# Patient Record
Sex: Female | Born: 1970 | Race: Black or African American | Hispanic: No | Smoking: Never smoker
Health system: Southern US, Community
[De-identification: ages and names within clinical notes are randomized; demographics above are authoritative.]

## PROBLEM LIST (undated history)

## (undated) DIAGNOSIS — E119 Type 2 diabetes mellitus without complications: Secondary | ICD-10-CM

## (undated) DIAGNOSIS — I10 Essential (primary) hypertension: Secondary | ICD-10-CM

## (undated) DIAGNOSIS — I639 Cerebral infarction, unspecified: Secondary | ICD-10-CM

## (undated) DIAGNOSIS — R531 Weakness: Secondary | ICD-10-CM

## (undated) DIAGNOSIS — N189 Chronic kidney disease, unspecified: Secondary | ICD-10-CM

## (undated) HISTORY — PX: ABDOMINAL HYSTERECTOMY: SHX81

## (undated) HISTORY — PX: EYE SURGERY: SHX253

---

## 2000-09-29 ENCOUNTER — Emergency Department (HOSPITAL_COMMUNITY): Admission: EM | Admit: 2000-09-29 | Discharge: 2000-09-29 | Payer: Self-pay | Admitting: Internal Medicine

## 2018-08-13 ENCOUNTER — Emergency Department (HOSPITAL_COMMUNITY): Payer: Medicaid - Out of State

## 2018-08-13 ENCOUNTER — Inpatient Hospital Stay (HOSPITAL_COMMUNITY)
Admission: EM | Admit: 2018-08-13 | Discharge: 2018-08-17 | DRG: 065 | Disposition: A | Discharge status: Home Health | Payer: Medicaid - Out of State | Attending: Internal Medicine | Admitting: Internal Medicine

## 2018-08-13 ENCOUNTER — Other Ambulatory Visit: Payer: Self-pay

## 2018-08-13 ENCOUNTER — Encounter (HOSPITAL_COMMUNITY): Payer: Self-pay | Admitting: Emergency Medicine

## 2018-08-13 DIAGNOSIS — E669 Obesity, unspecified: Secondary | ICD-10-CM | POA: Diagnosis present

## 2018-08-13 DIAGNOSIS — Z794 Long term (current) use of insulin: Secondary | ICD-10-CM

## 2018-08-13 DIAGNOSIS — E785 Hyperlipidemia, unspecified: Secondary | ICD-10-CM | POA: Diagnosis present

## 2018-08-13 DIAGNOSIS — Z7902 Long term (current) use of antithrombotics/antiplatelets: Secondary | ICD-10-CM

## 2018-08-13 DIAGNOSIS — E11649 Type 2 diabetes mellitus with hypoglycemia without coma: Secondary | ICD-10-CM | POA: Diagnosis present

## 2018-08-13 DIAGNOSIS — R253 Fasciculation: Secondary | ICD-10-CM | POA: Diagnosis present

## 2018-08-13 DIAGNOSIS — I69320 Aphasia following cerebral infarction: Secondary | ICD-10-CM

## 2018-08-13 DIAGNOSIS — Z79899 Other long term (current) drug therapy: Secondary | ICD-10-CM

## 2018-08-13 DIAGNOSIS — I6502 Occlusion and stenosis of left vertebral artery: Secondary | ICD-10-CM | POA: Diagnosis present

## 2018-08-13 DIAGNOSIS — I10 Essential (primary) hypertension: Secondary | ICD-10-CM | POA: Diagnosis present

## 2018-08-13 DIAGNOSIS — Z7982 Long term (current) use of aspirin: Secondary | ICD-10-CM

## 2018-08-13 DIAGNOSIS — E1151 Type 2 diabetes mellitus with diabetic peripheral angiopathy without gangrene: Secondary | ICD-10-CM | POA: Diagnosis present

## 2018-08-13 DIAGNOSIS — I6389 Other cerebral infarction: Secondary | ICD-10-CM | POA: Diagnosis not present

## 2018-08-13 DIAGNOSIS — Z90710 Acquired absence of both cervix and uterus: Secondary | ICD-10-CM

## 2018-08-13 DIAGNOSIS — R29704 NIHSS score 4: Secondary | ICD-10-CM | POA: Diagnosis present

## 2018-08-13 DIAGNOSIS — Z8249 Family history of ischemic heart disease and other diseases of the circulatory system: Secondary | ICD-10-CM

## 2018-08-13 DIAGNOSIS — I639 Cerebral infarction, unspecified: Secondary | ICD-10-CM | POA: Diagnosis present

## 2018-08-13 DIAGNOSIS — Z6834 Body mass index (BMI) 34.0-34.9, adult: Secondary | ICD-10-CM

## 2018-08-13 DIAGNOSIS — Z79891 Long term (current) use of opiate analgesic: Secondary | ICD-10-CM

## 2018-08-13 DIAGNOSIS — I63512 Cerebral infarction due to unspecified occlusion or stenosis of left middle cerebral artery: Secondary | ICD-10-CM | POA: Diagnosis not present

## 2018-08-13 DIAGNOSIS — I69351 Hemiplegia and hemiparesis following cerebral infarction affecting right dominant side: Secondary | ICD-10-CM

## 2018-08-13 DIAGNOSIS — E119 Type 2 diabetes mellitus without complications: Secondary | ICD-10-CM

## 2018-08-13 DIAGNOSIS — G40209 Localization-related (focal) (partial) symptomatic epilepsy and epileptic syndromes with complex partial seizures, not intractable, without status epilepticus: Secondary | ICD-10-CM | POA: Diagnosis present

## 2018-08-13 HISTORY — DX: Cerebral infarction, unspecified: I63.9

## 2018-08-13 HISTORY — DX: Essential (primary) hypertension: I10

## 2018-08-13 HISTORY — DX: Type 2 diabetes mellitus without complications: E11.9

## 2018-08-13 LAB — DIFFERENTIAL
Abs Immature Granulocytes: 0.02 10*3/uL (ref 0.00–0.07)
Basophils Absolute: 0.1 10*3/uL (ref 0.0–0.1)
Basophils Relative: 1 %
EOS PCT: 3 %
Eosinophils Absolute: 0.2 10*3/uL (ref 0.0–0.5)
Immature Granulocytes: 0 %
LYMPHS ABS: 1.9 10*3/uL (ref 0.7–4.0)
LYMPHS PCT: 25 %
MONOS PCT: 8 %
Monocytes Absolute: 0.6 10*3/uL (ref 0.1–1.0)
NEUTROS ABS: 4.9 10*3/uL (ref 1.7–7.7)
Neutrophils Relative %: 63 %

## 2018-08-13 LAB — CBC
HCT: 36 % (ref 36.0–46.0)
HEMOGLOBIN: 11.4 g/dL — AB (ref 12.0–15.0)
MCH: 27.1 pg (ref 26.0–34.0)
MCHC: 31.7 g/dL (ref 30.0–36.0)
MCV: 85.7 fL (ref 80.0–100.0)
Platelets: 386 10*3/uL (ref 150–400)
RBC: 4.2 MIL/uL (ref 3.87–5.11)
RDW: 14 % (ref 11.5–15.5)
WBC: 7.7 10*3/uL (ref 4.0–10.5)
nRBC: 0 % (ref 0.0–0.2)

## 2018-08-13 LAB — COMPREHENSIVE METABOLIC PANEL
ALK PHOS: 63 U/L (ref 38–126)
ALT: 19 U/L (ref 0–44)
AST: 16 U/L (ref 15–41)
Albumin: 4.4 g/dL (ref 3.5–5.0)
Anion gap: 9 (ref 5–15)
BILIRUBIN TOTAL: 1 mg/dL (ref 0.3–1.2)
BUN: 14 mg/dL (ref 6–20)
CALCIUM: 9.5 mg/dL (ref 8.9–10.3)
CO2: 25 mmol/L (ref 22–32)
CREATININE: 1.24 mg/dL — AB (ref 0.44–1.00)
Chloride: 106 mmol/L (ref 98–111)
GFR calc Af Amer: 58 mL/min — ABNORMAL LOW (ref >60)
GFR, EST NON AFRICAN AMERICAN: 50 mL/min — AB (ref >60)
Glucose, Bld: 78 mg/dL (ref 70–99)
Potassium: 3.6 mmol/L (ref 3.5–5.1)
SODIUM: 140 mmol/L (ref 135–145)
TOTAL PROTEIN: 9 g/dL — AB (ref 6.5–8.1)

## 2018-08-13 LAB — I-STAT CHEM 8, ED
BUN: 14 mg/dL (ref 6–20)
Calcium, Ion: 1.16 mmol/L (ref 1.15–1.40)
Chloride: 106 mmol/L (ref 98–111)
Creatinine, Ser: 1.3 mg/dL — ABNORMAL HIGH (ref 0.44–1.00)
Glucose, Bld: 75 mg/dL (ref 70–99)
HCT: 37 % (ref 36.0–46.0)
HEMOGLOBIN: 12.6 g/dL (ref 12.0–15.0)
Potassium: 3.7 mmol/L (ref 3.5–5.1)
SODIUM: 140 mmol/L (ref 135–145)
TCO2: 24 mmol/L (ref 22–32)

## 2018-08-13 LAB — CBG MONITORING, ED
GLUCOSE-CAPILLARY: 64 mg/dL — AB (ref 70–99)
Glucose-Capillary: 121 mg/dL — ABNORMAL HIGH (ref 70–99)

## 2018-08-13 LAB — I-STAT BETA HCG BLOOD, ED (MC, WL, AP ONLY)
HCG, QUANTITATIVE: 15.9 m[IU]/mL — AB (ref <5)
I-stat hCG, quantitative: 17.6 m[IU]/mL — ABNORMAL HIGH (ref <5)

## 2018-08-13 LAB — I-STAT TROPONIN, ED: Troponin i, poc: 0 ng/mL (ref 0.00–0.08)

## 2018-08-13 LAB — APTT: aPTT: 28 seconds (ref 24–36)

## 2018-08-13 LAB — PROTIME-INR
INR: 0.97
PROTHROMBIN TIME: 12.8 s (ref 11.4–15.2)

## 2018-08-13 LAB — ETHANOL

## 2018-08-13 MED ORDER — IOPAMIDOL (ISOVUE-370) INJECTION 76%
150.0000 mL | Freq: Once | INTRAVENOUS | Status: AC | PRN
  Administered 2018-08-13: 115 mL via INTRAVENOUS

## 2018-08-13 NOTE — ED Triage Notes (Signed)
Family reports symptoms started last Thursday.

## 2018-08-13 NOTE — ED Notes (Signed)
Patient resting at this time.

## 2018-08-13 NOTE — ED Notes (Signed)
Food tray given ton patient along with ginger-ale and orange juice per physician. Patient blood glucose was 64.

## 2018-08-13 NOTE — ED Provider Notes (Addendum)
[facility]ENCY DEPARTMENT Provider Note   CSN: [id number] Arrival date & time: 08/13/18  1450     History   Chief Complaint Chief Complaint  Patient presents with  . Seizures    HPI Julia Ferguson is a 47 y.o. female.  [facility].  Patient discharged on the 16th.  She had MR a CT Angie of head neck with findings consistent with left acute infarct.  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  Patient's story starts back in 2017 when she had her first stroke.  2018 she had a more significant stroke which left her with right-sided weakness to arm and leg.  And speech problems.  Patient had similar type presentation leading to this admission with episodic events that patient's family thought were more seizure-like.  But probably were stroke related.  They did started to develop concerns for any significant vasculitis.  So patient had labs drawn for this.  However patient was discharged home with speech therapy occupational therapy and follow-up with neurology but they were unable to get into neurology.  Patient was seen by primary care provider today.  They witnessed 1 of these episodic events it is that seem to be some contraction in her right arm.  And some temporary speech worsening.  [facility] but they could not make those arrangements so family brought her here.       Past Medical History:  Diagnosis Date  . Diabetes mellitus without complication (HCC)   . Hypertension   . Stroke [facility](HCC)     There are no active problems to display for this patient.   Past Surgical History:  Procedure Laterality Date  . ABDOMINAL HYSTERECTOMY    . CESAREAN SECTION       OB History   None      Home Medications    Prior to Admission medications   Medication Sig Start Date End Date Taking? Authorizing Provider  amLODipine (NORVASC) 10 MG  tablet Take by mouth.   Yes [provider]  aspirin EC 81 MG tablet Take by mouth.   Yes [provider]  atorvastatin (LIPITOR) 80 MG tablet Take by mouth.   Yes [provider]  carvedilol (COREG) 25 MG tablet Take by mouth.   Yes [provider]  clopidogrel (PLAVIX) 75 MG tablet Take by mouth.   Yes [provider]  ezetimibe (ZETIA) 10 MG tablet  08/12/18  Yes [provider]  gabapentin (NEURONTIN) 600 MG tablet Take by mouth.   Yes [provider]  glimepiride (AMARYL) 2 MG tablet  08/11/18  Yes [provider]  Insulin Glargine, 2 Unit Dial, (TOUJEO MAX SOLOSTAR) 300 UNIT/ML SOPN Inject into the skin.   Yes [provider]  losartan (COZAAR) 100 MG tablet Take 100 mg by mouth daily.   Yes [provider]  minoxidil (LONITEN) 2.5 MG tablet Take by mouth.   Yes [provider]  traMADol (ULTRAM) 50 MG tablet Take by mouth.   Yes [provider]    Family History Family History  Problem Relation Age of Onset  . Hypertension Other   . Kidney failure Other     Social History Social History   Tobacco Use  . Smoking status: Never Smoker  . Smokeless tobacco: Never Used  Substance Use Topics  . Alcohol use: Never  Frequency: Never  . Drug use: Never     Allergies   Patient has no known allergies.   Review of Systems Review of Systems  Constitutional: Negative for fever.  HENT: Negative for congestion.   Eyes: Positive for visual disturbance.  Respiratory: Negative for shortness of breath.   Cardiovascular: Negative for chest pain.  Gastrointestinal: Negative for abdominal pain.  Genitourinary: Negative for dysuria and hematuria.  Musculoskeletal: Negative for back pain and neck pain.  Skin: Negative for rash.  Neurological: Positive for seizures, speech difficulty and weakness.  Hematological: Does not bruise/bleed easily.  Psychiatric/Behavioral: Negative  for confusion.     Physical Exam Updated Vital Signs BP 129/81   Pulse 75   Temp 98 F (36.7 C) (Oral)   Resp 14   Ht 1.6 m (5\' 3" )   Wt 85.3 kg   SpO2 96%   BMI 33.30 kg/m   Physical Exam  Constitutional: She is oriented to person, place, and time. She appears well-developed and well-nourished. No distress.  HENT:  Head: Normocephalic and atraumatic.  Mouth/Throat: Oropharynx is clear and moist.  Eyes: Pupils are equal, round, and reactive to light. EOM are normal.  Neck: Normal range of motion. Neck supple.  Cardiovascular: Normal rate, regular rhythm and normal heart sounds.  Pulmonary/Chest: Effort normal and breath sounds normal. No respiratory distress.  Abdominal: Soft. Bowel sounds are normal. There is no tenderness.  Musculoskeletal: Normal range of motion. She exhibits no edema.  Neurological: She is alert and oriented to person, place, and time.  Some slurred speech baseline.  Weakness to right arm and right leg.  This is also baseline according to family.  Skin: Skin is warm.  Nursing note and vitals reviewed.    ED Treatments / Results  Labs (all labs ordered are listed, but only abnormal results are displayed) Labs Reviewed  CBC - Abnormal; Notable for the following components:      Result Value   Hemoglobin 11.4 (*)    All other components within normal limits  COMPREHENSIVE METABOLIC PANEL - Abnormal; Notable for the following components:   Creatinine, Ser 1.24 (*)    Total Protein 9.0 (*)    GFR calc non Af Amer 50 (*)    GFR calc Af Amer 58 (*)    All other components within normal limits  I-STAT CHEM 8, ED - Abnormal; Notable for the following components:   Creatinine, Ser 1.30 (*)    All other components within normal limits  I-STAT BETA HCG BLOOD, ED ([facility], [facility], [facility] ONLY) - Abnormal; Notable for the following components:   I-stat hCG, quantitative 17.6 (*)    All other components within normal limits  ETHANOL  PROTIME-INR  APTT  DIFFERENTIAL    RAPID URINE DRUG SCREEN, HOSP PERFORMED  URINALYSIS, ROUTINE W REFLEX MICROSCOPIC  I-STAT TROPONIN, ED    EKG EKG Interpretation  Date/Time:  Thursday August 13 2018 17:27:17 EST Ventricular Rate:  78 PR Interval:    QRS Duration: 85 QT Interval:  421 QTC Calculation: 480 R Axis:   -14 Text Interpretation:  Sinus rhythm LVH with secondary repolarization abnormality Anterior Q waves, possibly due to LVH No previous ECGs available Confirmed by Vanetta Mulders [phone]) on 08/13/2018 5:41:50 PM   Radiology Ct Angio Head W Or Wo Contrast  Result Date: 08/13/2018 CLINICAL DATA:  Arterial stricture/occlusion of the head and neck. Weakness beginning 2 weeks ago. Recent admission for stroke. Episodes of shaking and absence staring. EXAM: CT ANGIOGRAPHY HEAD AND  NECK CT PERFUSION BRAIN TECHNIQUE: Multidetector CT imaging of the head and neck was performed using the standard protocol during bolus administration of intravenous contrast. Multiplanar CT image reconstructions and MIPs were obtained to evaluate the vascular anatomy. Carotid stenosis measurements (when applicable) are obtained utilizing NASCET criteria, using the distal internal carotid diameter as the denominator. Multiphase CT imaging of the brain was performed following IV bolus contrast injection. Subsequent parametric perfusion maps were calculated using RAPID software. CONTRAST:  ISOVUE-370 IOPAMIDOL (ISOVUE-370) INJECTION 76% COMPARISON:  MRI brain 08/13/2018 at 4:37 p.m. FINDINGS: CT HEAD FINDINGS Brain: Remote left ACA territory infarct is noted. Additional areas of remote left MCA territory infarcts are present. The areas of more acute infarct are less well delineated on noncontrast CT of the head. The right hemisphere is unremarkable. The brainstem and cerebellum are normal. Vascular: No hyperdense vessel or unexpected calcification. Skull: Calvarium is intact. No focal lytic or blastic lesions are present. Sinuses/Orbits:  The paranasal sinuses mastoid air cells are clear. Globes and orbits are within normal limits. ASPECTS (Alberta Stroke Program Early CT Score) - Ganglionic level infarction (caudate, lentiform nuclei, internal capsule, insula, M1-M3 cortex): 7/7 - Supraganglionic infarction (M4-M6 cortex): 3/3 Total score (0-10 with 10 being normal): 10/10 Review of the MIP images confirms the above findings CTA NECK FINDINGS Aortic arch: A three-vessel arch configuration is present. There is no significant calcification at the aortic arch or stenosis. Right carotid system: Right common carotid artery is within normal limits. Right ICA bifurcation scratched at the right carotid bifurcation is normal. There is tortuosity of the distal right ICA without significant stenosis. Left carotid system: The left common carotid artery is within normal limits. Bifurcation is unremarkable. There is moderate tortuosity of the distal cervical ICA without significant stenosis. Vertebral arteries: The vertebral arteries originate from the subclavian arteries bilaterally. There is a high-grade stenosis of the left vertebral artery at its origin. The right vertebral artery is the dominant vessel. No other focal stenosis or vascular injury is present. Skeleton: Vertebral body heights alignment are maintained. 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 No focal lytic or blastic lesions are present. Hyperostosis is noted. Other neck: Subcentimeter nodules are present within the right lobe of the thyroid. No dominant lesion is present. No follow-up is necessary. No significant cervical adenopathy is present. Salivary glands are within normal limits. No focal mucosal lesions are present. Upper chest: There is dependent atelectasis at the lung apices. Mild interstitial coarsening suggests mild edema. Review of the MIP images confirms the above findings CTA HEAD FINDINGS Anterior circulation: Atherosclerotic changes are  present within the cavernous internal carotid arteries bilaterally. The lumen of the right ICA is narrowed to 1.5 mm. This compares to more distal vessel of 3 mm. There is a high-grade stenosis of the paraclinoid left ICA. 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 This compares with 2.6 mm at the bifurcation. More mild atherosclerotic changes are present in the right M1 segment. Right MCA bifurcation is intact. Left MCA bifurcation is intact. There is some attenuation of distal MCA branch vessels. Chronic left ACA occlusion is noted. Posterior circulation: The right vertebral artery is the dominant vessel. PICA origins are visualized and normal. Vertebrobasilar junction is normal. Basilar artery is within normal limits. Both posterior cerebral arteries originate from the basilar tip. There is moderate narrowing of the proximal P2 segments bilaterally. Distal PCA branch vessels are intact with distal irregularity. Venous  sinuses: Dural sinuses are patent. Straight sinus deep cerebral veins are intact. Cortical veins are within normal limits. Anatomic variants: None Delayed phase: Postcontrast images demonstrate no pathologic enhancement. 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 Review of the MIP images confirms the above findings CT Brain Perfusion Findings: CBF (<30%) Volume: 6mL Perfusion (Tmax>6.0s) volume: 87mL Mismatch Volume: 81mL Infarction Location:Left MCA territory IMPRESSION: 1. 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 2. The area of reduced cerebral blood flow on the CT perfusion corresponds to a remote infarct and is likely artifactual. The area of acute ischemia on the MRI is not demonstrated on the CT perfusion exam. 3. Large area of ischemic penumbra involving the residual left MCA territory, likely secondary to the high-grade stenoses proximally. 4. Approximately 50%  stenosis of the cavernous right internal carotid artery. 5. Tortuosity of the cervical internal carotid arteries bilaterally without significant stenosis. 6. 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 Electronically Signed   By: Marin Roberts M.D.   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 Weakness beginning 2 weeks ago. Recent admission for stroke. Episodes of shaking and absence staring. EXAM: CT ANGIOGRAPHY HEAD AND NECK CT PERFUSION BRAIN TECHNIQUE: Multidetector CT imaging of the head and neck was performed using the standard protocol during bolus administration of intravenous contrast. Multiplanar CT image reconstructions and MIPs were obtained to evaluate the vascular anatomy. Carotid stenosis measurements (when applicable) are obtained utilizing NASCET criteria, using the distal internal carotid diameter as the denominator. Multiphase CT imaging of the brain was performed following IV bolus contrast injection. Subsequent parametric perfusion maps were calculated using RAPID software. CONTRAST:  ISOVUE-370 IOPAMIDOL (ISOVUE-370) INJECTION 76% COMPARISON:  MRI brain 08/13/2018 at 4:37 p.m. FINDINGS: CT HEAD FINDINGS Brain: Remote left ACA territory infarct is noted. Additional areas of remote left MCA territory infarcts are present. The areas of more acute infarct are less well delineated on noncontrast CT of the head. The right hemisphere is unremarkable. The brainstem and cerebellum are normal. Vascular: No hyperdense vessel or unexpected calcification. Skull: Calvarium is intact. No focal lytic or blastic lesions are present. Sinuses/Orbits: The paranasal sinuses mastoid air cells are clear. Globes and orbits are within normal limits. ASPECTS (Alberta Stroke Program Early CT Score) - Ganglionic level infarction (caudate, lentiform nuclei, internal capsule,  insula, M1-M3 cortex): 7/7 - Supraganglionic infarction (M4-M6 cortex): 3/3 Total score (0-10 with 10 being normal): 10/10 Review of the MIP images confirms the above findings CTA NECK FINDINGS Aortic arch: A three-vessel arch configuration is present. There is no significant calcification at the aortic arch or stenosis. Right carotid system: Right common carotid artery is within normal limits. Right ICA bifurcation scratched at the right carotid bifurcation is normal. There is tortuosity of the distal right ICA without significant stenosis. Left carotid system: The left common carotid artery is within normal limits. Bifurcation is unremarkable. There is moderate tortuosity of the distal cervical ICA without significant stenosis. Vertebral arteries: The vertebral arteries originate from the subclavian arteries bilaterally. There is a high-grade stenosis of the left vertebral artery at its origin. The right vertebral artery is the dominant vessel. No other focal stenosis or vascular injury is present. Skeleton: Vertebral body heights alignment are maintained. 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 No focal  lytic or blastic lesions are present. Hyperostosis is noted. Other neck: Subcentimeter nodules are present within the right lobe of the thyroid. No dominant lesion is present. No follow-up is necessary. No significant cervical adenopathy is present. Salivary glands are within normal limits. No focal mucosal lesions are present. Upper chest: There is dependent atelectasis at the lung apices. Mild interstitial coarsening suggests mild edema. Review of the MIP images confirms the above findings CTA HEAD FINDINGS Anterior circulation: Atherosclerotic changes are present within the cavernous internal carotid arteries bilaterally. The lumen of the right ICA is narrowed to 1.5 mm. This compares to more distal vessel of 3 mm. There is a high-grade stenosis of the paraclinoid left ICA.  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 This compares with 2.6 mm at the bifurcation. More mild atherosclerotic changes are present in the right M1 segment. Right MCA bifurcation is intact. Left MCA bifurcation is intact. There is some attenuation of distal MCA branch vessels. Chronic left ACA occlusion is noted. Posterior circulation: The right vertebral artery is the dominant vessel. PICA origins are visualized and normal. Vertebrobasilar junction is normal. Basilar artery is within normal limits. Both posterior cerebral arteries originate from the basilar tip. There is moderate narrowing of the proximal P2 segments bilaterally. Distal PCA branch vessels are intact with distal irregularity. Venous sinuses: Dural sinuses are patent. Straight sinus deep cerebral veins are intact. Cortical veins are within normal limits. Anatomic variants: None Delayed phase: Postcontrast images demonstrate no pathologic enhancement. 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 Review of the MIP images confirms the above findings CT Brain Perfusion Findings: CBF (<30%) Volume: 6mL Perfusion (Tmax>6.0s) volume: 87mL Mismatch Volume: 81mL Infarction Location:Left MCA territory IMPRESSION: 1. 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 2. The area of reduced cerebral blood flow on the CT perfusion corresponds to a remote infarct and is likely artifactual. The area of acute ischemia on the MRI is not demonstrated on the CT perfusion exam. 3. Large area of ischemic penumbra involving the residual left MCA territory, likely secondary to the high-grade stenoses proximally. 4. 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 5. Tortuosity of the cervical internal carotid arteries bilaterally without significant stenosis. 6. 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  Electronically Signed   By: Marin Roberts M.D.   On: 08/13/2018 19:29   Mr Brain Wo Contrast (neuro Protocol)  Result Date: 08/13/2018 CLINICAL DATA:  Seizure like episodes for 2 weeks. History of stroke, hypertension and diabetes. EXAM: MRI HEAD WITHOUT CONTRAST TECHNIQUE: Multiplanar, multiecho pulse sequences of the brain and surrounding structures were obtained without intravenous contrast. COMPARISON:  None. FINDINGS: Mild motion degraded examination. INTRACRANIAL CONTENTS: Patchy LEFT parietal reduced diffusion with nearly normalized ADC values and bright FLAIR signal. Spurious reduced diffusion mesial LEFT frontoparietal lobes associated with confluent encephalomalacia and susceptibility artifact. Ex vacuo dilatation LEFT lateral ventricle. Patchy LEFT parietooccipital lobe encephalomalacia. Smaller T2 bright LEFT cerebral peduncle and patchy LEFT pontine T2 hyperintense signal consistent with wallerian degeneration. Patchy supratentorial white matter FLAIR T2 hyperintensities. No susceptibility artifact to suggest hemorrhage. The ventricles and sulci are normal for patient's age. No suspicious parenchymal signal, masses, mass effect. No abnormal extra-axial fluid collections. No extra-axial masses. VASCULAR: Normal major intracranial vascular flow voids present at skull base. SKULL AND UPPER CERVICAL SPINE: No abnormal sellar expansion. No suspicious  calvarial bone marrow signal. Craniocervical junction maintained. SINUSES/ORBITS: The mastoid air-cells and included paranasal sinuses are well-aerated.The included ocular globes and orbital contents are non-suspicious. OTHER: None. IMPRESSION: 1. Mild motion degraded examination. Acute to subacute LEFT parietal/MCA territory infarct. 2. 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 3. Mild-to-moderate chronic small vessel ischemic changes. Electronically Signed   By: Awilda Metro M.D.   On: 08/13/2018 17:39   Ct  Cerebral Perfusion W Contrast  Result Date: 08/13/2018 CLINICAL DATA:  Arterial stricture/occlusion of the head and neck. Weakness beginning 2 weeks ago. Recent admission for stroke. Episodes of shaking and absence staring. EXAM: CT ANGIOGRAPHY HEAD AND NECK CT PERFUSION BRAIN TECHNIQUE: Multidetector CT imaging of the head and neck was performed using the standard protocol during bolus administration of intravenous contrast. Multiplanar CT image reconstructions and MIPs were obtained to evaluate the vascular anatomy. Carotid stenosis measurements (when applicable) are obtained utilizing NASCET criteria, using the distal internal carotid diameter as the denominator. Multiphase CT imaging of the brain was performed following IV bolus contrast injection. Subsequent parametric perfusion maps were calculated using RAPID software. CONTRAST:  ISOVUE-370 IOPAMIDOL (ISOVUE-370) INJECTION 76% COMPARISON:  MRI brain 08/13/2018 at 4:37 p.m. FINDINGS: CT HEAD FINDINGS Brain: Remote left ACA territory infarct is noted. Additional areas of remote left MCA territory infarcts are present. The areas of more acute infarct are less well delineated on noncontrast CT of the head. The right hemisphere is unremarkable. The brainstem and cerebellum are normal. Vascular: No hyperdense vessel or unexpected calcification. Skull: Calvarium is intact. No focal lytic or blastic lesions are present. Sinuses/Orbits: The paranasal sinuses mastoid air cells are clear. Globes and orbits are within normal limits. ASPECTS (Alberta Stroke Program Early CT Score) - Ganglionic level infarction (caudate, lentiform nuclei, internal capsule, insula, M1-M3 cortex): 7/7 - Supraganglionic infarction (M4-M6 cortex): 3/3 Total score (0-10 with 10 being normal): 10/10 Review of the MIP images confirms the above findings CTA NECK FINDINGS Aortic arch: A three-vessel arch configuration is present. There is no significant calcification at the aortic arch or  stenosis. Right carotid system: Right common carotid artery is within normal limits. Right ICA bifurcation scratched at the right carotid bifurcation is normal. There is tortuosity of the distal right ICA without significant stenosis. Left carotid system: The left common carotid artery is within normal limits. Bifurcation is unremarkable. There is moderate tortuosity of the distal cervical ICA without significant stenosis. Vertebral arteries: The vertebral arteries originate from the subclavian arteries bilaterally. There is a high-grade stenosis of the left vertebral artery at its origin. The right vertebral artery is the dominant vessel. No other focal stenosis or vascular injury is present. Skeleton: Vertebral body heights alignment are maintained. 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 No focal lytic or blastic lesions are present. Hyperostosis is noted. Other neck: Subcentimeter nodules are present within the right lobe of the thyroid. No dominant lesion is present. No follow-up is necessary. No significant cervical adenopathy is present. Salivary glands are within normal limits. No focal mucosal lesions are present. Upper chest: There is dependent atelectasis at the lung apices. Mild interstitial coarsening suggests mild edema. Review of the MIP images confirms the above findings CTA HEAD FINDINGS Anterior circulation: Atherosclerotic changes are present within the cavernous internal carotid arteries bilaterally. The lumen of the right ICA is narrowed to 1.5 mm. This compares to more distal vessel of 3 mm. There is a high-grade stenosis of the paraclinoid left ICA.  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 This compares with 2.6 mm at the bifurcation. More mild atherosclerotic changes are present in the right M1 segment. Right MCA bifurcation is intact. Left MCA bifurcation is intact. There is some attenuation of distal MCA branch  vessels. Chronic left ACA occlusion is noted. Posterior circulation: The right vertebral artery is the dominant vessel. PICA origins are visualized and normal. Vertebrobasilar junction is normal. Basilar artery is within normal limits. Both posterior cerebral arteries originate from the basilar tip. There is moderate narrowing of the proximal P2 segments bilaterally. Distal PCA branch vessels are intact with distal irregularity. Venous sinuses: Dural sinuses are patent. Straight sinus deep cerebral veins are intact. Cortical veins are within normal limits. Anatomic variants: None Delayed phase: Postcontrast images demonstrate no pathologic enhancement. 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 Review of the MIP images confirms the above findings CT Brain Perfusion Findings: CBF (<30%) Volume: 6mL Perfusion (Tmax>6.0s) volume: 87mL Mismatch Volume: 81mL Infarction Location:Left MCA territory IMPRESSION: 1. 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 2. The area of reduced cerebral blood flow on the CT perfusion corresponds to a remote infarct and is likely artifactual. The area of acute ischemia on the MRI is not demonstrated on the CT perfusion exam. 3. Large area of ischemic penumbra involving the residual left MCA territory, likely secondary to the high-grade stenoses proximally. 4. 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 5. Tortuosity of the cervical internal carotid arteries bilaterally without significant stenosis. 6. 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 Electronically Signed   By: Marin Roberts M.D.   On: 08/13/2018 19:29    Procedures Procedures (including critical care time)  CRITICAL CARE Performed by: Vanetta Mulders Total critical care time: 60 minutes Critical care time was exclusive of separately billable procedures and treating other  patients. Critical care was necessary to treat or prevent imminent or life-threatening deterioration. Critical care was time spent personally by me on the following activities: development of treatment plan with patient and/or surrogate as well as nursing, discussions with consultants, evaluation of patient's response to treatment, examination of patient, obtaining history from patient or surrogate, ordering and performing treatments and interventions, ordering and review of laboratory studies, ordering and review of radiographic studies, pulse oximetry and re-evaluation of patient's condition.   Medications Ordered in ED Medications  iopamidol (ISOVUE-370) 76 % injection 150 mL (115 mLs Intravenous Contrast Given 08/13/18 1909)     Initial Impression / Assessment and Plan / ED Course  I have reviewed the triage vital signs and the nursing notes.  Pertinent labs & imaging results that were available during my care of the patient were reviewed by me and considered in my medical decision making (see chart for details).     Patient with extensive work-up here with concerns for either worsening stroke or completely new or acute stroke.  Patient had been admitted to [facility] on November 15 with CT MR and CTA findings consistent with a new infarct.  Patient's history significant for 2017 she had her first stroke.  No residual problems after that.  However than 2018 she had a another stroke.  With some speech problems and residual right no no-sided weakness.  Based on the paperwork from [facility] this recent admission showed worsening stroke symptoms and that is same area of the brain.  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  They properly raise concerns for patient having a arteritis or vasculitis.  Ordered labs for this.  But those results never came back.  Patient was to follow outpatient with neurology however could not get an appointment in that area.   Saw her primary care today.  Was having some episodic episodes that were similar to what led to the admission on the 15th.  They would come and go so primary care doctor tried to get her evaluated by neurology in [facility] but could not make arrangements.  So family brought her here at advice of a friend.  Family describes the symptoms as small seizures.  But not witnessed here but most likely episodic episodes due to poor brain perfusion.  Patient now essentially back to baseline.  Patient had MR CT angios head neck and had perfusion studies.  Findings showed just of subacute findings consistent most likely what they were seen during the admission on November 15.  Nothing acute.  The based on her episodic symptoms.  Findings discussed with neuro hospitalist Dr. Jerrell Belfast.  He reviewed her studies.  He raised also the concern for a vasculitis or arteritis.  Patient symptoms are probably do that she has such poor blood flow to the brain due to stenotic problems on the left side of the brain that there is probably waxing and waning in the blood flow and brings on some symptoms.  Discussion with family we decided to go ahead and make arrangements for admission although no acute changes.  Patient does require further work-up hospitalist will make for arrangements to hospitalist service at [facility].  The neuro hospitalist will get involved.  There was discussion about having the interventional radiologist Evans wire evaluate patient.  And also continue to do a work-up for vasculitis.  Final Clinical Impressions(s) / ED Diagnoses   Final diagnoses:  Cerebrovascular accident (CVA), unspecified mechanism [facility])    ED Discharge Orders    None       Vanetta Mulders, MD 08/14/18 [id number]    Vanetta Mulders, MD 08/14/18 [phone]

## 2018-08-13 NOTE — ED Triage Notes (Signed)
Patient has been having episodes of shaking, absence staring, and weakness that started a couple of weeks ago. Patient has hx of CVA, referred by PCP to ED.

## 2018-08-13 NOTE — Progress Notes (Signed)
Received report from Shanda BumpsJessica, Charity fundraiserN at [facility].

## 2018-08-13 NOTE — H&P (Signed)
History and Physical    Julia Ferguson UEA:[id number] DOB: 1970/11/05 DOA: 08/13/2018  PCP: Hezzie Bump, FNP   Patient coming from: Home  Chief Complaint: Abnormal right upper extremity and head movement  HPI: Julia Ferguson is a 47 y.o. female with medical history significant for CVA, HTN, DM, who was brought to the ED by family with complaints of spasm/jerking of right upper extremity, with twitching/turning of her face to the left, and staring of 1 week duration- started 11/14, also same day-patient had increased slurred speech and right-sided weakness that -but this lasted 1 day.  Family reports about one episode of this abnormal movements daily, last about  ~ 15 seconds.  Patient is aware of these movements and responds to her name when called.  She does not lose consciousness.  No noted abnormal movements of right lower extremity. Patient was admitted at [facility] in [place] with the symptoms 11/15- 11/16.  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  But patient is able to ambulate with a cane or walker-and [facility]  Extensive work-up was done on this recent hospital admission, which included stroke workup and a vasculitis work-up was started.  Lipid A1c also checked. TSH- 3.43, MAg- 2.1, Phos- 3.8.  Patient brought discharge recordsof recent admission with her (Check Physical chart) - her mother and brother help with history.  Imaging included CT, MRI.  MRI-lacunar strokes concerning for arteritis. 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  Vasculitis and hypercoagulable work-up obtained and were pending at the time of discharge-lupus anticoagulant, protein C, protein S, Antithrombin III, leiden factor V, homocystine, anticardiolipin, antiphospholipid, factor II DNA.   Patient was to follow-up with Neurologist, Dr. Clent Ridges -if arterial biopsy was needed, to rule out giant cell arteritis, Takayasu.  There was a consideration for low-dose Xarelto. Patient was to follow-up with neurology as outpatient.   Patient saw her PCP today, unfortunately neurology follow-up was unsuccessful, so patient presented to the ED.  History of multiple strokes in paternal grandfather.  They reside in [place].  ED Course: Blood pressure systolic 130s to 478G.  Glucose low 78, mild elevated creatinine 1.2 ( recent cr- 1.32) , Hgb 11.4, otherwise unremarkable CBC CMP.  Brain MRI-motion degraded, acute to subacute left parietal/MCA territory infarct.  Old large left ACA and left posterior watershed territory infarct.  Mild to moderate chronic small vessel ischemic changes.  CT angios head and neck also do not see detailed report below.  Neurology on call- Dr. Arora-recommended admission to [facility], concern for vasculitis/arteritis. Neurology was seen in consult, also interventional radiology consult in a.m. Continue aspirin and Plavix for now.  Review of Systems: As per HPI, other systems reviewed and negative.  Past Medical History:  Diagnosis Date  . Diabetes mellitus without complication (HCC)   . Hypertension   . Stroke [facility])     Past Surgical History:  Procedure Laterality Date  . ABDOMINAL HYSTERECTOMY    . CESAREAN SECTION       reports that she has never smoked. She has never used smokeless tobacco. She reports that she does not drink alcohol or use drugs.  No Known Allergies  Family History  Problem Relation Age of Onset  . Hypertension Other   . Kidney failure Other     Prior to Admission medications   Medication  Sig Start Date End Date Taking? Authorizing Provider  amLODipine (NORVASC) 10 MG tablet Take by mouth.   Yes [provider]  aspirin EC 81 MG tablet Take by mouth.   Yes [provider]  atorvastatin (LIPITOR) 80 MG tablet Take by  mouth.   Yes [provider]  carvedilol (COREG) 25 MG tablet Take by mouth.   Yes [provider]  clopidogrel (PLAVIX) 75 MG tablet Take by mouth.   Yes [provider]  ezetimibe (ZETIA) 10 MG tablet  08/12/18  Yes [provider]  gabapentin (NEURONTIN) 600 MG tablet Take by mouth.   Yes [provider]  glimepiride (AMARYL) 2 MG tablet  08/11/18  Yes [provider]  Insulin Glargine, 2 Unit Dial, (TOUJEO MAX SOLOSTAR) 300 UNIT/ML SOPN Inject into the skin.   Yes [provider]  losartan (COZAAR) 100 MG tablet Take 100 mg by mouth daily.   Yes [provider]  minoxidil (LONITEN) 2.5 MG tablet Take by mouth.   Yes [provider]  traMADol (ULTRAM) 50 MG tablet Take by mouth.   Yes [provider]    Physical Exam: Vitals:   08/13/18 1600 08/13/18 1800 08/13/18 1900 08/13/18 2000  BP: 137/68 (!) 144/95 140/80 129/81  Pulse:   84 75  Resp: 17 13 16 14   Temp:      TempSrc:      SpO2:   96% 96%  Weight:      Height:        Constitutional: NAD, calm, comfortable Vitals:   08/13/18 1600 08/13/18 1800 08/13/18 1900 08/13/18 2000  BP: 137/68 (!) 144/95 140/80 129/81  Pulse:   84 75  Resp: 17 13 16 14   Temp:      TempSrc:      SpO2:   96% 96%  Weight:      Height:       Eyes: PERRL, lids and conjunctivae normal ENMT: Mucous membranes are dry. Posterior pharynx clear of any exudate or lesions.  Neck: normal, supple, no masses, no thyromegaly Respiratory: clear to auscultation bilaterally, no wheezing, no crackles. Normal respiratory effort. No accessory muscle use.  Cardiovascular: Regular rate and rhythm, no murmurs / rubs / gallops. No extremity edema. 2+ pedal pulses. No carotid bruits.  Abdomen: no tenderness, no masses palpated. No hepatosplenomegaly. Bowel sounds positive.  Musculoskeletal: no clubbing / cyanosis. No joint deformity upper and lower extremities. Good ROM, no  contractures. Normal muscle tone.  Skin: no rashes, lesions, ulcers. No induration Neurologic: CN 2-12 grossly intact. Sensation intact, DTR normal.  Mild speech deficits , easy to understand , strength RUE- 4/5, RLE- 4/5, Left extremities- 5/5.   Psychiatric: Normal judgment and insight. Alert and oriented x 3. Normal mood.   Labs on Admission: I have personally reviewed following labs and imaging studies  CBC: Recent Labs  Lab 08/13/18 1722 08/13/18 1731  WBC 7.7  --   NEUTROABS 4.9  --   HGB 11.4* 12.6  HCT 36.0 37.0  MCV 85.7  --   PLT 386  --    Basic Metabolic Panel: Recent Labs  Lab 08/13/18 1722 08/13/18 1731  NA 140 140  K 3.6 3.7  CL 106 106  CO2 25  --   GLUCOSE 78 75  BUN 14 14  CREATININE 1.24* 1.30*  CALCIUM 9.5  --    GFR: Estimated Creatinine Clearance: 54.2 mL/min (A) (by C-G formula based on SCr of 1.3 mg/dL (H)). Liver Function  Tests: Recent Labs  Lab 08/13/18 1722  AST 16  ALT 19  ALKPHOS 63  BILITOT 1.0  PROT 9.0*  ALBUMIN 4.4   Coagulation Profile: Recent Labs  Lab 08/13/18 1722  INR 0.97    Radiological Exams on Admission: Ct Angio Head W Or Wo Contrast  Result Date: 08/13/2018 CLINICAL DATA:  Arterial stricture/occlusion of the head and neck. Weakness beginning 2 weeks ago. Recent admission for stroke. Episodes of shaking and absence staring. EXAM: CT ANGIOGRAPHY HEAD AND NECK CT PERFUSION BRAIN TECHNIQUE: Multidetector CT imaging of the head and neck was performed using the standard protocol during bolus administration of intravenous contrast. Multiplanar CT image reconstructions and MIPs were obtained to evaluate the vascular anatomy. Carotid stenosis measurements (when applicable) are obtained utilizing NASCET criteria, using the distal internal carotid diameter as the denominator. Multiphase CT imaging of the brain was performed following IV bolus contrast injection. Subsequent parametric perfusion maps were calculated using RAPID  software. CONTRAST:  ISOVUE-370 IOPAMIDOL (ISOVUE-370) INJECTION 76% COMPARISON:  MRI brain 08/13/2018 at 4:37 p.m. FINDINGS: CT HEAD FINDINGS Brain: Remote left ACA territory infarct is noted. Additional areas of remote left MCA territory infarcts are present. The areas of more acute infarct are less well delineated on noncontrast CT of the head. The right hemisphere is unremarkable. The brainstem and cerebellum are normal. Vascular: No hyperdense vessel or unexpected calcification. Skull: Calvarium is intact. No focal lytic or blastic lesions are present. Sinuses/Orbits: The paranasal sinuses mastoid air cells are clear. Globes and orbits are within normal limits. ASPECTS (Alberta Stroke Program Early CT Score) - Ganglionic level infarction (caudate, lentiform nuclei, internal capsule, insula, M1-M3 cortex): 7/7 - Supraganglionic infarction (M4-M6 cortex): 3/3 Total score (0-10 with 10 being normal): 10/10 Review of the MIP images confirms the above findings CTA NECK FINDINGS Aortic arch: A three-vessel arch configuration is present. There is no significant calcification at the aortic arch or stenosis. Right carotid system: Right common carotid artery is within normal limits. Right ICA bifurcation scratched at the right carotid bifurcation is normal. There is tortuosity of the distal right ICA without significant stenosis. Left carotid system: The left common carotid artery is within normal limits. Bifurcation is unremarkable. There is moderate tortuosity of the distal cervical ICA without significant stenosis. Vertebral arteries: The vertebral arteries originate from the subclavian arteries bilaterally. There is a high-grade stenosis of the left vertebral artery at its origin. The right vertebral artery is the dominant vessel. No other focal stenosis or vascular injury is present. Skeleton: Vertebral body heights alignment are maintained. 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 No focal lytic or blastic lesions are present. Hyperostosis is noted. Other neck: Subcentimeter nodules are present within the right lobe of the thyroid. No dominant lesion is present. No follow-up is necessary. No significant cervical adenopathy is present. Salivary glands are within normal limits. No focal mucosal lesions are present. Upper chest: There is dependent atelectasis at the lung apices. Mild interstitial coarsening suggests mild edema. Review of the MIP images confirms the above findings CTA HEAD FINDINGS Anterior circulation: Atherosclerotic changes are present within the cavernous internal carotid arteries bilaterally. The lumen of the right ICA is narrowed to 1.5 mm. This compares to more distal vessel of 3 mm. There is a high-grade stenosis of the paraclinoid left ICA. 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 This compares with 2.6 mm at the bifurcation. More mild atherosclerotic changes are  present in the right M1 segment. Right MCA bifurcation is intact. Left MCA bifurcation is intact. There is some attenuation of distal MCA branch vessels. Chronic left ACA occlusion is noted. Posterior circulation: The right vertebral artery is the dominant vessel. PICA origins are visualized and normal. Vertebrobasilar junction is normal. Basilar artery is within normal limits. Both posterior cerebral arteries originate from the basilar tip. There is moderate narrowing of the proximal P2 segments bilaterally. Distal PCA branch vessels are intact with distal irregularity. Venous sinuses: Dural sinuses are patent. Straight sinus deep cerebral veins are intact. Cortical veins are within normal limits. Anatomic variants: None Delayed phase: Postcontrast images demonstrate no pathologic enhancement. 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 Review of the MIP images confirms the above findings CT Brain Perfusion Findings: CBF  (<30%) Volume: 6mL Perfusion (Tmax>6.0s) volume: 87mL Mismatch Volume: 81mL Infarction Location:Left MCA territory IMPRESSION: 1. 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 2. The area of reduced cerebral blood flow on the CT perfusion corresponds to a remote infarct and is likely artifactual. The area of acute ischemia on the MRI is not demonstrated on the CT perfusion exam. 3. Large area of ischemic penumbra involving the residual left MCA territory, likely secondary to the high-grade stenoses proximally. 4. 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 5. Tortuosity of the cervical internal carotid arteries bilaterally without significant stenosis. 6. 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 Electronically Signed   By: Marin Roberts M.D.   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 Weakness beginning 2 weeks ago. Recent admission for stroke. Episodes of shaking and absence staring. EXAM: CT ANGIOGRAPHY HEAD AND NECK CT PERFUSION BRAIN TECHNIQUE: Multidetector CT imaging of the head and neck was performed using the standard protocol during bolus administration of intravenous contrast. Multiplanar CT image reconstructions and MIPs were obtained to evaluate the vascular anatomy. Carotid stenosis measurements (when applicable) are obtained utilizing NASCET criteria, using the distal internal carotid diameter as the denominator. Multiphase CT imaging of the brain was performed following IV bolus contrast injection. Subsequent parametric perfusion maps were calculated using RAPID software. CONTRAST:  ISOVUE-370 IOPAMIDOL (ISOVUE-370) INJECTION 76% COMPARISON:  MRI brain 08/13/2018 at 4:37 p.m. FINDINGS: CT HEAD FINDINGS Brain: Remote left ACA territory infarct is noted. Additional areas of  remote left MCA territory infarcts are present. The areas of more acute infarct are less well delineated on noncontrast CT of the head. The right hemisphere is unremarkable. The brainstem and cerebellum are normal. Vascular: No hyperdense vessel or unexpected calcification. Skull: Calvarium is intact. No focal lytic or blastic lesions are present. Sinuses/Orbits: The paranasal sinuses mastoid air cells are clear. Globes and orbits are within normal limits. ASPECTS (Alberta Stroke Program Early CT Score) - Ganglionic level infarction (caudate, lentiform nuclei, internal capsule, insula, M1-M3 cortex): 7/7 - Supraganglionic infarction (M4-M6 cortex): 3/3 Total score (0-10 with 10 being normal): 10/10 Review of the MIP images confirms the above findings CTA NECK FINDINGS Aortic arch: A three-vessel arch configuration is present. There is no significant calcification at the aortic arch or stenosis. Right carotid system: Right common carotid artery is within normal limits. Right ICA bifurcation scratched at the right carotid bifurcation is normal. There is tortuosity of the distal right ICA without significant stenosis. Left  carotid system: The left common carotid artery is within normal limits. Bifurcation is unremarkable. There is moderate tortuosity of the distal cervical ICA without significant stenosis. Vertebral arteries: The vertebral arteries originate from the subclavian arteries bilaterally. There is a high-grade stenosis of the left vertebral artery at its origin. The right vertebral artery is the dominant vessel. No other focal stenosis or vascular injury is present. Skeleton: Vertebral body heights alignment are maintained. 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 No focal lytic or blastic lesions are present. Hyperostosis is noted. Other neck: Subcentimeter nodules are present within the right lobe of the thyroid. No dominant lesion is present. No follow-up is necessary.  No significant cervical adenopathy is present. Salivary glands are within normal limits. No focal mucosal lesions are present. Upper chest: There is dependent atelectasis at the lung apices. Mild interstitial coarsening suggests mild edema. Review of the MIP images confirms the above findings CTA HEAD FINDINGS Anterior circulation: Atherosclerotic changes are present within the cavernous internal carotid arteries bilaterally. The lumen of the right ICA is narrowed to 1.5 mm. This compares to more distal vessel of 3 mm. There is a high-grade stenosis of the paraclinoid left ICA. 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 This compares with 2.6 mm at the bifurcation. More mild atherosclerotic changes are present in the right M1 segment. Right MCA bifurcation is intact. Left MCA bifurcation is intact. There is some attenuation of distal MCA branch vessels. Chronic left ACA occlusion is noted. Posterior circulation: The right vertebral artery is the dominant vessel. PICA origins are visualized and normal. Vertebrobasilar junction is normal. Basilar artery is within normal limits. Both posterior cerebral arteries originate from the basilar tip. There is moderate narrowing of the proximal P2 segments bilaterally. Distal PCA branch vessels are intact with distal irregularity. Venous sinuses: Dural sinuses are patent. Straight sinus deep cerebral veins are intact. Cortical veins are within normal limits. Anatomic variants: None Delayed phase: Postcontrast images demonstrate no pathologic enhancement. 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 Review of the MIP images confirms the above findings CT Brain Perfusion Findings: CBF (<30%) Volume: 6mL Perfusion (Tmax>6.0s) volume: 87mL Mismatch Volume: 81mL Infarction Location:Left MCA territory IMPRESSION: 1. 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 2. The  area of reduced cerebral blood flow on the CT perfusion corresponds to a remote infarct and is likely artifactual. The area of acute ischemia on the MRI is not demonstrated on the CT perfusion exam. 3. Large area of ischemic penumbra involving the residual left MCA territory, likely secondary to the high-grade stenoses proximally. 4. 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 5. Tortuosity of the cervical internal carotid arteries bilaterally without significant stenosis. 6. 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 Electronically Signed   By: Marin Roberts M.D.   On: 08/13/2018 19:29   Mr Brain Wo Contrast (neuro Protocol)  Result Date: 08/13/2018 CLINICAL DATA:  Seizure like episodes for 2 weeks. History of stroke, hypertension and diabetes. EXAM: MRI HEAD WITHOUT CONTRAST TECHNIQUE: Multiplanar, multiecho pulse sequences of the brain and surrounding structures were obtained without intravenous contrast. COMPARISON:  None. FINDINGS: Mild motion degraded examination. INTRACRANIAL CONTENTS: Patchy LEFT parietal reduced diffusion with nearly normalized ADC values and bright FLAIR signal. Spurious reduced diffusion mesial LEFT frontoparietal lobes associated with confluent encephalomalacia and susceptibility artifact. Ex  vacuo dilatation LEFT lateral ventricle. Patchy LEFT parietooccipital lobe encephalomalacia. Smaller T2 bright LEFT cerebral peduncle and patchy LEFT pontine T2 hyperintense signal consistent with wallerian degeneration. Patchy supratentorial white matter FLAIR T2 hyperintensities. No susceptibility artifact to suggest hemorrhage. The ventricles and sulci are normal for patient's age. No suspicious parenchymal signal, masses, mass effect. No abnormal extra-axial fluid collections. No extra-axial masses. VASCULAR: Normal major intracranial vascular flow voids present at skull base. SKULL AND UPPER CERVICAL SPINE: No abnormal  sellar expansion. No suspicious calvarial bone marrow signal. Craniocervical junction maintained. SINUSES/ORBITS: The mastoid air-cells and included paranasal sinuses are well-aerated.The included ocular globes and orbital contents are non-suspicious. OTHER: None. IMPRESSION: 1. Mild motion degraded examination. Acute to subacute LEFT parietal/MCA territory infarct. 2. 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 3. Mild-to-moderate chronic small vessel ischemic changes. Electronically Signed   By: Awilda Metro M.D.   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 Weakness beginning 2 weeks ago. Recent admission for stroke. Episodes of shaking and absence staring. EXAM: CT ANGIOGRAPHY HEAD AND NECK CT PERFUSION BRAIN TECHNIQUE: Multidetector CT imaging of the head and neck was performed using the standard protocol during bolus administration of intravenous contrast. Multiplanar CT image reconstructions and MIPs were obtained to evaluate the vascular anatomy. Carotid stenosis measurements (when applicable) are obtained utilizing NASCET criteria, using the distal internal carotid diameter as the denominator. Multiphase CT imaging of the brain was performed following IV bolus contrast injection. Subsequent parametric perfusion maps were calculated using RAPID software. CONTRAST:  ISOVUE-370 IOPAMIDOL (ISOVUE-370) INJECTION 76% COMPARISON:  MRI brain 08/13/2018 at 4:37 p.m. FINDINGS: CT HEAD FINDINGS Brain: Remote left ACA territory infarct is noted. Additional areas of remote left MCA territory infarcts are present. The areas of more acute infarct are less well delineated on noncontrast CT of the head. The right hemisphere is unremarkable. The brainstem and cerebellum are normal. Vascular: No hyperdense vessel or unexpected calcification. Skull: Calvarium is intact. No focal lytic  or blastic lesions are present. Sinuses/Orbits: The paranasal sinuses mastoid air cells are clear. Globes and orbits are within normal limits. ASPECTS (Alberta Stroke Program Early CT Score) - Ganglionic level infarction (caudate, lentiform nuclei, internal capsule, insula, M1-M3 cortex): 7/7 - Supraganglionic infarction (M4-M6 cortex): 3/3 Total score (0-10 with 10 being normal): 10/10 Review of the MIP images confirms the above findings CTA NECK FINDINGS Aortic arch: A three-vessel arch configuration is present. There is no significant calcification at the aortic arch or stenosis. Right carotid system: Right common carotid artery is within normal limits. Right ICA bifurcation scratched at the right carotid bifurcation is normal. There is tortuosity of the distal right ICA without significant stenosis. Left carotid system: The left common carotid artery is within normal limits. Bifurcation is unremarkable. There is moderate tortuosity of the distal cervical ICA without significant stenosis. Vertebral arteries: The vertebral arteries originate from the subclavian arteries bilaterally. There is a high-grade stenosis of the left vertebral artery at its origin. The right vertebral artery is the dominant vessel. No other focal stenosis or vascular injury is present. Skeleton: Vertebral body heights alignment are maintained. 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 No focal lytic or blastic lesions are present. Hyperostosis is noted. Other neck: Subcentimeter nodules are present within the right lobe of the thyroid. No dominant lesion is present. No follow-up is necessary. No significant cervical  adenopathy is present. Salivary glands are within normal limits. No focal mucosal lesions are present. Upper chest: There is dependent atelectasis at the lung apices. Mild interstitial coarsening suggests mild edema. Review of the MIP images confirms the above findings CTA HEAD FINDINGS  Anterior circulation: Atherosclerotic changes are present within the cavernous internal carotid arteries bilaterally. The lumen of the right ICA is narrowed to 1.5 mm. This compares to more distal vessel of 3 mm. There is a high-grade stenosis of the paraclinoid left ICA. 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 This compares with 2.6 mm at the bifurcation. More mild atherosclerotic changes are present in the right M1 segment. Right MCA bifurcation is intact. Left MCA bifurcation is intact. There is some attenuation of distal MCA branch vessels. Chronic left ACA occlusion is noted. Posterior circulation: The right vertebral artery is the dominant vessel. PICA origins are visualized and normal. Vertebrobasilar junction is normal. Basilar artery is within normal limits. Both posterior cerebral arteries originate from the basilar tip. There is moderate narrowing of the proximal P2 segments bilaterally. Distal PCA branch vessels are intact with distal irregularity. Venous sinuses: Dural sinuses are patent. Straight sinus deep cerebral veins are intact. Cortical veins are within normal limits. Anatomic variants: None Delayed phase: Postcontrast images demonstrate no pathologic enhancement. 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 Review of the MIP images confirms the above findings CT Brain Perfusion Findings: CBF (<30%) Volume: 6mL Perfusion (Tmax>6.0s) volume: 87mL Mismatch Volume: 81mL Infarction Location:Left MCA territory IMPRESSION: 1. 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 2. The area of reduced cerebral blood flow on the CT perfusion corresponds to a remote infarct and is likely artifactual. The area of acute ischemia on the MRI is not demonstrated on the CT perfusion exam. 3. Large area of ischemic penumbra involving the residual left MCA territory, likely secondary to the high-grade  stenoses proximally. 4. 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 5. Tortuosity of the cervical internal carotid arteries bilaterally without significant stenosis. 6. 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 Electronically Signed   By: Marin Roberts M.D.   On: 08/13/2018 19:29    EKG: Independently reviewed.  Sinus rhythm.  QTC 480.  t wave inversions lateral leads- No old EKG to compare.  Assessment/Plan Principal Problem:   CVA (cerebral vascular accident) (HCC) Active Problems:   HTN (hypertension)   DM (diabetes mellitus) (HCC)   CVA-MRI shows acute to subacute left parietal/MCA territory infarct, old large left ACA, posterior watershed territory infarcts. CTA shows possible stenotic areas.  Concern for vasculitis/arthritis.  Neurology- dr. Wilford Corner, consulted in ED-Recommends transfer from [facility] to [facility].  Vasculitis work-up underway at [facility] [place], [place].  -Vasculitis and hypercoagulable work-up ordered- lupus anticoagulant, protein C, protein S, Antithrombin III, leiden factor V, homocystine, anticardiolipin, antiphospholipid, factor II DNA. -Request records from [facility], [place].  - Lipid panel 11/16-LDL 102, total cholesterol 159, triglycerides 73, HDL 42. - 11/16- Hgba1c- 6.9 -I do not see an echo report- will order -With possible acute stroke MRI- will hold antihypertensives to allow for permissive hypertension. -Continue aspirin Plavix -Consult neurology on arrival [facility] -Consult Interventional radiology ( ?dr. Corliss Skains in a.m)  Abnormal movement- head and right upper extremity, ?? related to old large left infarct.  - EEG - ??Antiseizure meds  HTN-stable. -Hold home Coreg, losartan, minoxidil,  in the setting of acute CVA -PRN labetalol  DM- type 2, glucose 68 > improved after eating to 121.  Patient took her long-acting insulin basaglar today 45u, and has not eaten all day.  Hgba1c- 6.9 08/08/18. - SSI - Hold long acting insulin for now.  Positive i-STAT beta-hCG- check actual serum hcg.  HIV as part of routine health screen   DVT prophylaxis: Scds pending neurology, IR eval Code Status: Full Family Communication: Mother, Father, brother at bedside Disposition Plan: Per rounding team Consults called: [facility]rology on [facility] at [facility], IR consult in a.m Admission status: Obs, tele   Onnie BoerEjiroghene E Gerrell Tabet MD [facility] Pager [phone] From 3PM-11PM.  Otherwise please contact night-coverage [email or web address] Password [email or web address]  08/13/2018, 12:30 AM

## 2018-08-13 NOTE — ED Notes (Signed)
Purewick placed on pt. 

## 2018-08-14 ENCOUNTER — Observation Stay (HOSPITAL_COMMUNITY): Payer: Medicaid - Out of State

## 2018-08-14 ENCOUNTER — Other Ambulatory Visit: Payer: Self-pay

## 2018-08-14 ENCOUNTER — Observation Stay (HOSPITAL_BASED_OUTPATIENT_CLINIC_OR_DEPARTMENT_OTHER): Payer: Medicaid - Out of State

## 2018-08-14 DIAGNOSIS — Z79891 Long term (current) use of opiate analgesic: Secondary | ICD-10-CM | POA: Diagnosis not present

## 2018-08-14 DIAGNOSIS — I6789 Other cerebrovascular disease: Secondary | ICD-10-CM

## 2018-08-14 DIAGNOSIS — Z8249 Family history of ischemic heart disease and other diseases of the circulatory system: Secondary | ICD-10-CM | POA: Diagnosis not present

## 2018-08-14 DIAGNOSIS — G40209 Localization-related (focal) (partial) symptomatic epilepsy and epileptic syndromes with complex partial seizures, not intractable, without status epilepticus: Secondary | ICD-10-CM

## 2018-08-14 DIAGNOSIS — Z7902 Long term (current) use of antithrombotics/antiplatelets: Secondary | ICD-10-CM | POA: Diagnosis not present

## 2018-08-14 DIAGNOSIS — I639 Cerebral infarction, unspecified: Secondary | ICD-10-CM

## 2018-08-14 DIAGNOSIS — Z79899 Other long term (current) drug therapy: Secondary | ICD-10-CM | POA: Diagnosis not present

## 2018-08-14 DIAGNOSIS — Z6834 Body mass index (BMI) 34.0-34.9, adult: Secondary | ICD-10-CM | POA: Diagnosis not present

## 2018-08-14 DIAGNOSIS — E1159 Type 2 diabetes mellitus with other circulatory complications: Secondary | ICD-10-CM

## 2018-08-14 DIAGNOSIS — Z794 Long term (current) use of insulin: Secondary | ICD-10-CM | POA: Diagnosis not present

## 2018-08-14 DIAGNOSIS — R29704 NIHSS score 4: Secondary | ICD-10-CM | POA: Diagnosis present

## 2018-08-14 DIAGNOSIS — I10 Essential (primary) hypertension: Secondary | ICD-10-CM | POA: Diagnosis present

## 2018-08-14 DIAGNOSIS — I69351 Hemiplegia and hemiparesis following cerebral infarction affecting right dominant side: Secondary | ICD-10-CM | POA: Diagnosis not present

## 2018-08-14 DIAGNOSIS — Z90710 Acquired absence of both cervix and uterus: Secondary | ICD-10-CM | POA: Diagnosis not present

## 2018-08-14 DIAGNOSIS — E669 Obesity, unspecified: Secondary | ICD-10-CM | POA: Diagnosis present

## 2018-08-14 DIAGNOSIS — I6502 Occlusion and stenosis of left vertebral artery: Secondary | ICD-10-CM | POA: Diagnosis present

## 2018-08-14 DIAGNOSIS — E1151 Type 2 diabetes mellitus with diabetic peripheral angiopathy without gangrene: Secondary | ICD-10-CM | POA: Diagnosis present

## 2018-08-14 DIAGNOSIS — I63232 Cerebral infarction due to unspecified occlusion or stenosis of left carotid arteries: Secondary | ICD-10-CM

## 2018-08-14 DIAGNOSIS — I679 Cerebrovascular disease, unspecified: Secondary | ICD-10-CM | POA: Diagnosis not present

## 2018-08-14 DIAGNOSIS — I6300 Cerebral infarction due to thrombosis of unspecified precerebral artery: Secondary | ICD-10-CM | POA: Diagnosis not present

## 2018-08-14 DIAGNOSIS — R253 Fasciculation: Secondary | ICD-10-CM | POA: Diagnosis present

## 2018-08-14 DIAGNOSIS — Z7982 Long term (current) use of aspirin: Secondary | ICD-10-CM | POA: Diagnosis not present

## 2018-08-14 DIAGNOSIS — Z8673 Personal history of transient ischemic attack (TIA), and cerebral infarction without residual deficits: Secondary | ICD-10-CM

## 2018-08-14 DIAGNOSIS — I63512 Cerebral infarction due to unspecified occlusion or stenosis of left middle cerebral artery: Secondary | ICD-10-CM | POA: Diagnosis present

## 2018-08-14 DIAGNOSIS — I69320 Aphasia following cerebral infarction: Secondary | ICD-10-CM | POA: Diagnosis not present

## 2018-08-14 DIAGNOSIS — E785 Hyperlipidemia, unspecified: Secondary | ICD-10-CM | POA: Diagnosis present

## 2018-08-14 DIAGNOSIS — E11649 Type 2 diabetes mellitus with hypoglycemia without coma: Secondary | ICD-10-CM | POA: Diagnosis present

## 2018-08-14 LAB — RAPID URINE DRUG SCREEN, HOSP PERFORMED
Amphetamines: NOT DETECTED
Barbiturates: NOT DETECTED
Benzodiazepines: NOT DETECTED
Cocaine: NOT DETECTED
Opiates: NOT DETECTED
Tetrahydrocannabinol: NOT DETECTED

## 2018-08-14 LAB — URINALYSIS, ROUTINE W REFLEX MICROSCOPIC
BILIRUBIN URINE: NEGATIVE
Glucose, UA: NEGATIVE mg/dL
Hgb urine dipstick: NEGATIVE
KETONES UR: NEGATIVE mg/dL
Leukocytes, UA: NEGATIVE
NITRITE: NEGATIVE
Protein, ur: NEGATIVE mg/dL
Specific Gravity, Urine: 1.024 (ref 1.005–1.030)
pH: 5 (ref 5.0–8.0)

## 2018-08-14 LAB — ECHOCARDIOGRAM COMPLETE
Height: 62 in
Weight: 3019.42 oz

## 2018-08-14 LAB — ANTITHROMBIN III: ANTITHROMB III FUNC: 121 % — AB (ref 75–120)

## 2018-08-14 LAB — HCG, QUANTITATIVE, PREGNANCY: HCG, BETA CHAIN, QUANT, S: 4 m[IU]/mL (ref <5)

## 2018-08-14 LAB — HIV ANTIBODY (ROUTINE TESTING W REFLEX): HIV Screen 4th Generation wRfx: NONREACTIVE

## 2018-08-14 LAB — GLUCOSE, CAPILLARY
GLUCOSE-CAPILLARY: 119 mg/dL — AB (ref 70–99)
Glucose-Capillary: 118 mg/dL — ABNORMAL HIGH (ref 70–99)
Glucose-Capillary: 100 mg/dL — ABNORMAL HIGH (ref 70–99)
Glucose-Capillary: 154 mg/dL — ABNORMAL HIGH (ref 70–99)

## 2018-08-14 LAB — CBG MONITORING, ED: GLUCOSE-CAPILLARY: 99 mg/dL (ref 70–99)

## 2018-08-14 MED ORDER — LEVETIRACETAM 500 MG PO TABS
500.0000 mg | ORAL_TABLET | Freq: Two times a day (BID) | ORAL | Status: DC
  Administered 2018-08-14 – 2018-08-17 (×7): 500 mg via ORAL
  Filled 2018-08-14 (×7): qty 1

## 2018-08-14 MED ORDER — GABAPENTIN 600 MG PO TABS
600.0000 mg | ORAL_TABLET | Freq: Every day | ORAL | Status: DC
  Administered 2018-08-15 – 2018-08-16 (×2): 600 mg via ORAL
  Filled 2018-08-14 (×2): qty 1

## 2018-08-14 MED ORDER — CHLORHEXIDINE GLUCONATE 0.12 % MT SOLN
15.0000 mL | Freq: Two times a day (BID) | OROMUCOSAL | Status: DC
  Administered 2018-08-14 – 2018-08-17 (×7): 15 mL via OROMUCOSAL
  Filled 2018-08-14 (×7): qty 15

## 2018-08-14 MED ORDER — CLOPIDOGREL BISULFATE 75 MG PO TABS
75.0000 mg | ORAL_TABLET | Freq: Every day | ORAL | Status: DC
  Administered 2018-08-15 – 2018-08-17 (×3): 75 mg via ORAL
  Filled 2018-08-14 (×3): qty 1

## 2018-08-14 MED ORDER — ACETAMINOPHEN 160 MG/5ML PO SOLN: 650.0000 mg | ORAL | Status: DC | PRN

## 2018-08-14 MED ORDER — SODIUM CHLORIDE 0.9 % IV SOLN
INTRAVENOUS | Status: DC
  Administered 2018-08-14 – 2018-08-17 (×3): via INTRAVENOUS

## 2018-08-14 MED ORDER — STROKE: EARLY STAGES OF RECOVERY BOOK
Freq: Once | Status: AC
  Administered 2018-08-15: 07:00:00
  Filled 2018-08-14 (×2): qty 1

## 2018-08-14 MED ORDER — ATORVASTATIN CALCIUM 80 MG PO TABS
80.0000 mg | ORAL_TABLET | Freq: Every day | ORAL | Status: DC
  Administered 2018-08-14 – 2018-08-16 (×4): 80 mg via ORAL
  Filled 2018-08-14 (×2): qty 1
  Filled 2018-08-14: qty 2
  Filled 2018-08-14: qty 1

## 2018-08-14 MED ORDER — ASPIRIN EC 81 MG PO TBEC
81.0000 mg | DELAYED_RELEASE_TABLET | Freq: Every day | ORAL | Status: DC
  Administered 2018-08-14 (×2): 81 mg via ORAL
  Filled 2018-08-14 (×2): qty 1

## 2018-08-14 MED ORDER — ASPIRIN EC 325 MG PO TBEC
325.0000 mg | DELAYED_RELEASE_TABLET | Freq: Every day | ORAL | Status: DC
  Administered 2018-08-15 – 2018-08-17 (×3): 325 mg via ORAL
  Filled 2018-08-14 (×3): qty 1

## 2018-08-14 MED ORDER — SENNOSIDES-DOCUSATE SODIUM 8.6-50 MG PO TABS: 1.0000 | ORAL_TABLET | Freq: Every evening | ORAL | Status: DC | PRN

## 2018-08-14 MED ORDER — ACETAMINOPHEN 325 MG PO TABS: 650.0000 mg | ORAL_TABLET | ORAL | Status: DC | PRN

## 2018-08-14 MED ORDER — ORAL CARE MOUTH RINSE
15.0000 mL | Freq: Two times a day (BID) | OROMUCOSAL | Status: DC
  Administered 2018-08-14 – 2018-08-16 (×5): 15 mL via OROMUCOSAL

## 2018-08-14 MED ORDER — EZETIMIBE 10 MG PO TABS
10.0000 mg | ORAL_TABLET | Freq: Every day | ORAL | Status: DC
  Administered 2018-08-14 – 2018-08-17 (×4): 10 mg via ORAL
  Filled 2018-08-14 (×4): qty 1

## 2018-08-14 MED ORDER — LABETALOL HCL 5 MG/ML IV SOLN: 10.0000 mg | INTRAVENOUS | Status: DC | PRN

## 2018-08-14 MED ORDER — INSULIN ASPART 100 UNIT/ML ~~LOC~~ SOLN
0.0000 [IU] | Freq: Three times a day (TID) | SUBCUTANEOUS | Status: DC
  Administered 2018-08-14 – 2018-08-15 (×2): 2 [IU] via SUBCUTANEOUS
  Administered 2018-08-15: 1 [IU] via SUBCUTANEOUS
  Administered 2018-08-16: 2 [IU] via SUBCUTANEOUS
  Administered 2018-08-16 – 2018-08-17 (×3): 1 [IU] via SUBCUTANEOUS

## 2018-08-14 MED ORDER — ACETAMINOPHEN 650 MG RE SUPP: 650.0000 mg | RECTAL | Status: DC | PRN

## 2018-08-14 MED ORDER — CLOPIDOGREL BISULFATE 75 MG PO TABS: 75.0000 mg | ORAL_TABLET | Freq: Every day | ORAL | Status: DC

## 2018-08-14 MED ORDER — STROKE: EARLY STAGES OF RECOVERY BOOK
Freq: Once | Status: AC
  Administered 2018-08-14: 03:00:00
  Filled 2018-08-14: qty 1

## 2018-08-14 MED ORDER — LEVETIRACETAM IN NACL 1000 MG/100ML IV SOLN
1000.0000 mg | INTRAVENOUS | Status: AC
  Administered 2018-08-14: 1000 mg via INTRAVENOUS
  Filled 2018-08-14: qty 100

## 2018-08-14 MED ORDER — CLOPIDOGREL BISULFATE 75 MG PO TABS
75.0000 mg | ORAL_TABLET | Freq: Every day | ORAL | Status: DC
  Administered 2018-08-14: 75 mg via ORAL
  Filled 2018-08-14 (×2): qty 1

## 2018-08-14 NOTE — Progress Notes (Signed)
EEG Completed; Results Pending  

## 2018-08-14 NOTE — Progress Notes (Deleted)
Portable equipment states there are no scds available. Will continue to monitor pt. Nelda MarseilleJenny Thacker, RN

## 2018-08-14 NOTE — Consult Note (Signed)
Neurology Consultation  Reason for Consult: Multiple strokes in the past 3 years with an acute stroke on MRI, seizure-like activity Referring Physician: Dr. Mariea Clonts  CC: Stroke, seizure  History is obtained from: Minimal chart from [facility], patient's parents  HPI: Twyla Nored is a 47 y.o. female past medical history of prior strokes with residual speech difficulty and right-sided weakness and spasticity, diabetes, hypertension, presenting to any pain emergency room for evaluation of right arm shaking jerking and left head version with preserved consciousness that lasted for a few minutes. According to the patient's family, she had a worsening of her right-sided weakness on 08/07/2018 and was taken to [facility] in [place] where she had some work-up done including an MRI that showed possible new strokes in the same territory as the old stroke. Her for stroke was in 2017 with no residual deficits.  The stroke in 2018 left her with right-sided hemiparesis and some speech difficulties.  Her speech was improving.  She had some short-term memory loss. On 08/07/2018, she had some worsening right-sided weakness for which she was admitted to [facility] hospital and found to have acute on chronic punctate left MCA territory strokes.  Vascular imaging was concerning for possible edema arteritis and a neurological consultation was recommended but never completed our appointment made due to scheduling issues. She is brought into any [facility] today for evaluation of right arm jerking, head version to the left that lasted a few minutes.  There is been no recurrence of the seizure-like activity since. MRI of the brain was done that showed the chronic left ACA and left posterior watershed territory infarcts along with acute to subacute left parietal/MCA territory infarct.  Mild to moderate small vessel disease. CTA head and neck shows high-grade stenosis of the supraclinoid left ICA and  the left M1.  There is reduced cerebral flow on CT perfusion corresponding to remote infarct and likely artifactual.  Large area of ischemic penumbra involving the residual left MCA territory likely secondary to high-grade stenosis proximally.  Approximately 50% stenosis of the cavernous right ICA.  Tortuosity of cervical internal carotid arteries bilaterally without significant stenosis and high-grade stenosis of the proximal left vertebral artery at its origin. She was transferred to [facility] for further evaluation as she has not had consistent neurological evaluation. Concerns were raised for vasculitis during her admission and at [facility] and biopsy was considered. She has not received a spinal tap.  Next line she has not had a diagnostic cerebral Carlyon Prows yet. Labs for vasculitis have not been sent. There was some work-up done there, records of which are not available to [place] at this time.  LKW: 7 days ago. tpa given?: no, outside the window Premorbid modified Rankin scale (mRS): 2  ROS:ROS was performed and is negative except as noted in the HPI.   Past Medical History:  Diagnosis Date  . Diabetes mellitus without complication (HCC)   . Hypertension   . Stroke [facility])     Family History  Problem Relation Age of Onset  . Hypertension Other   . Kidney failure Other    Social History:   reports that she has never smoked. She has never used smokeless tobacco. She reports that she does not drink alcohol or use drugs.  Medications  Current Facility-Administered Medications:  .   stroke: mapping our early stages of recovery book, , Does not apply, Once, Emokpae, Ejiroghene E, MD .  acetaminophen (TYLENOL) tablet 650 mg, 650 mg, Oral,  Q4H PRN **OR** acetaminophen (TYLENOL) solution 650 mg, 650 mg, Per Tube, Q4H PRN **OR** acetaminophen (TYLENOL) suppository 650 mg, 650 mg, Rectal, Q4H PRN, Emokpae, Ejiroghene E, MD .  aspirin EC tablet 81 mg, 81 mg, Oral, Daily,  Emokpae, Ejiroghene E, MD, 81 mg at 08/14/18 0100 .  atorvastatin (LIPITOR) tablet 80 mg, 80 mg, Oral, q1800, Emokpae, Ejiroghene E, MD, 80 mg at 08/14/18 0101 .  clopidogrel (PLAVIX) tablet 75 mg, 75 mg, Oral, Daily, Emokpae, Ejiroghene E, MD, 75 mg at 08/14/18 0101 .  ezetimibe (ZETIA) tablet 10 mg, 10 mg, Oral, Daily, Emokpae, Ejiroghene E, MD .  Melene Muller[START ON 08/15/2018] gabapentin (NEURONTIN) tablet 600 mg, 600 mg, Oral, QHS, Emokpae, Ejiroghene E, MD .  insulin aspart (novoLOG) injection 0-9 Units, 0-9 Units, Subcutaneous, TID WC, Emokpae, Ejiroghene E, MD .  labetalol (NORMODYNE,TRANDATE) injection 10 mg, 10 mg, Intravenous, Q2H PRN, Emokpae, Ejiroghene E, MD .  senna-docusate (Senokot-S) tablet 1 tablet, 1 tablet, Oral, QHS PRN, Emokpae, Ejiroghene E, MD  Exam: Current vital signs: BP (!) 160/84 (BP Location: Left Arm)   Pulse 77   Temp 98.7 F (37.1 C) (Oral)   Resp 19   Ht 5\' 2"  (1.575 m)   Wt 85.6 kg   SpO2 100%   BMI 34.52 kg/m  Vital signs in last 24 hours: Temp:  [98 F (36.7 C)-98.7 F (37.1 C)] 98.7 F (37.1 C) (11/22 0213) Pulse Rate:  [71-84] 77 (11/22 0213) Resp:  [12-19] 19 (11/22 0213) BP: (119-160)/(68-106) 160/84 (11/22 0213) SpO2:  [96 %-100 %] 100 % (11/22 0213) Weight:  [85.3 kg-85.6 kg] 85.6 kg (11/22 0213)  GENERAL: Awake, alert in NAD HEENT: - Normocephalic and atraumatic, dry mm, no LN++, no Thyromegally LUNGS - Clear to auscultation bilaterally with no wheezes CV - S1S2 RRR, no m/r/g, equal pulses bilaterally. ABDOMEN - Soft, nontender, nondistended with normoactive BS Ext: warm, well perfused, intact peripheral pulses, no edema  NEURO:  Mental Status: Awake, alert, oriented to self.  Not oriented to place or time. Language: speech is normal and not dysarthric.  Naming, comprehension for complex commands, intact but repetition mildly impaired. Cranial Nerves: PERRL. EOMI, visual fields-right homonymous hemianopsia, subtle right nasal labial fold  flattening facial sensation intact, hearing intact, tongue/uvula/soft palate midline, normal sternocleidomastoid and trapezius muscle strength. No evidence of tongue atrophy or fibrillations Motor: Right upper extremity 4-/5 with increased tone and mild drift in 10 seconds.  Right lower extremity 4-/5 with increased tone.  Left upper and lower extremity normal strength without any drift or hypotonia. Sensation-decreased on the right Coordination: FTN intact bilaterally Gait- deferred  NIHSS 1a Level of Conscious.: 0 1b LOC Questions: 0 1c LOC Commands: 0 2 Best Gaze: 0 3 Visual: 2 4 Facial Palsy: 1 5a Motor Arm - left: 0 5b Motor Arm - Right: 1 6a Motor Leg - Left: 0 6b Motor Leg - Right: 0 7 Limb Ataxia: 0 8 Sensory: 1 9 Best Language: 1 10 Dysarthria: 0 11 Extinct. and Inatten.: 0 TOTAL: 4  Labs I have reviewed labs in epic and the results pertinent to this consultation are: CBC    Component Value Date/Time   WBC 7.7 08/13/2018 1722   RBC 4.20 08/13/2018 1722   HGB 12.6 08/13/2018 1731   HCT 37.0 08/13/2018 1731   PLT 386 08/13/2018 1722   MCV 85.7 08/13/2018 1722   MCH 27.1 08/13/2018 1722   MCHC 31.7 08/13/2018 1722   RDW 14.0 08/13/2018 1722   LYMPHSABS 1.9 08/13/2018  1722   MONOABS 0.6 08/13/2018 1722   EOSABS 0.2 08/13/2018 1722   BASOSABS 0.1 08/13/2018 1722  CMP     Component Value Date/Time   NA 140 08/13/2018 1731   K 3.7 08/13/2018 1731   CL 106 08/13/2018 1731   CO2 25 08/13/2018 1722   GLUCOSE 75 08/13/2018 1731   BUN 14 08/13/2018 1731   CREATININE 1.30 (H) 08/13/2018 1731   CALCIUM 9.5 08/13/2018 1722   PROT 9.0 (H) 08/13/2018 1722   ALBUMIN 4.4 08/13/2018 1722   AST 16 08/13/2018 1722   ALT 19 08/13/2018 1722   ALKPHOS 63 08/13/2018 1722   BILITOT 1.0 08/13/2018 1722   GFRNONAA 50 (L) 08/13/2018 1722   GFRAA 58 (L) 08/13/2018 1722   Imaging I have reviewed the images obtained  MRI examination of the brain-large area of left MCA and ACA  encephalomalacia from prior stroke with acute to subacute left parietal/MCA territory infarct.  Mild to moderate small vessel disease. CTA head and neck shows high-grade stenosis of the supraclinoid left ICA and the left M1.  There is reduced cerebral flow on CT perfusion corresponding to remote infarct and likely artifactual.  Large area of ischemic penumbra involving the residual left MCA territory likely secondary to high-grade stenosis proximally.  Approximately 50% stenosis of the cavernous right ICA.  Tortuosity of cervical internal carotid arteries bilaterally without significant stenosis and high-grade stenosis of the proximal left vertebral artery at its origin.  Assessment:  47 year old woman with large left MCA stroke in 2018 with residual right hemiparesis and mild aphasia presenting for evaluation of worsening right-sided weakness as well as seizure-like activity involving the right side with preserved consciousness. 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 She has had scattered work-up done at outside hospital but never had a complete work-up done. There is some information that is pending from [facility] regarding what all has been done.  Presumably hypercoagulable testing has been done but results are not available. The mother tells me that she has never had an echocardiogram done. Radiology report/discharge summary mentions considering biopsy but a spinal tap and diagnostic cerebral angiogram have not been performed to the best of the family's knowledge.   Impression: Acute ischemic stroke - Evaluate for vasculitis versus autoimmune etiology given multiple stenosis on the Angie of Focal seizure  Recommendations: For the focal seizure: -Loaded with Keppra 1 g IV followed by 500 mg twice daily. -EEG - Seizure precautions  For the acute/subacute stroke and chronic stroke -Admit to hospitalist -Telemetry -2D  echocardiogram -A1c -Lipid panel -Vasculitic labs-check ANA, double-stranded DNA, lupus anticoagulant, anticardiolipin antibodies, anti-beta-2 glycoprotein, ANCA, serum C3 and C4, serum cryoglobulin's, serum and urine protein electrophoresis with immunoelectrophoresis, HIV, hepatitis B and C -Hypercoagulable panel - Contact Dr. Corliss Skains in the morning for a diagnostic cerebral angiogram -LP in the morning-send CSF for regular labs including protein, glucose, cell count, IgG index.  Stroke neurology to follow  -- Milon Dikes, MD [facility] Pager: [phone] If 7pm to 7am, please call on call as listed on AMION.

## 2018-08-14 NOTE — Progress Notes (Signed)
  OT Cancellation Note  Patient Details Name: Julia Ferguson MRN: [id number] DOB: 02/01/1971   Cancelled Treatment:    Reason Eval/Treat Not Completed: Patient at procedure or test/ unavailable. Plan to reattempt OT eval as able.  Raynald KempKathryn Rhylan Gross, OT Acute Rehabilitation Services Pager: [phone] Office: [phone]  08/14/2018, 1:09 PM

## 2018-08-14 NOTE — Progress Notes (Signed)
PROGRESS NOTE                                                                                                                                                                                                             Patient Demographics:    Julia Ferguson, is a 47 y.o. female, DOB - 1970-11-01, JXB:[id number]  Admit date - 08/13/2018   Admitting Physician Ejiroghene Wendall Stade, MD  Outpatient Primary MD for the patient is Plunk, Charlsie Quest, FNP  LOS - 0   Chief Complaint  Patient presents with  . Seizures       Brief Narrative    47 y.o. female with medical history significant for CVA, HTN, DM, who was brought to the ED by family with complaints of spasm/jerking of right upper extremity, patient with recent hospitalization at [facility] last week with worsening right-sided weakness and speech difficulty, doing a new stroke, patient was transferred for further work-up.   Subjective:    Julia Ferguson today has, No headache, No chest pain, No abdominal pain - No Nausea.   Assessment  & Plan :    Principal Problem:   CVA (cerebral vascular accident) (HCC) Active Problems:   HTN (hypertension)   DM (diabetes mellitus) (HCC)   Subacute CVA -With recent hospitalization at [facility], with diagnosis of acute CVA, GI input appreciated, MRI head showing acute to subacute left parietal/MCA territory infarct. -2D echo showing EF 65 to 70% with no cardiac source identified, following LDL, A1c, EEG. 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 Ky Barban  to perform angiogram in a.m.Marland Kitchen -Continue with aspirin and Plavix -Follow on autoimmune work-up as some suspicion may be related to vasculitis  Hypertension -Hold home Coreg,  losartan, minoxidil in the setting of acute CVA and allow for permissive hypertension  Diabetes mellitus, type II - A1c was 6.9 on 08/08/2018 -She is on 45 units GU daily, she was hypoglycemic yesterday on presentation, and her CBGs are controlled, she is going to be n.p.o., so we will continue with sliding scale currently.  Hyperlipidemia -New with statin, follow on LDL   Code Status : full  Family Communication  : Both parents  at bedside  Disposition Plan  : Pending PT consult  Barriers For Discharge :   Consults  : Neurology, interventional neurology  Procedures  : None  DVT Prophylaxis  : SCD  Lab Results  Component Value Date   PLT 386 08/13/2018    Antibiotics  :    Anti-infectives (From admission, onward)   None        Objective:   Vitals:   08/14/18 0824 08/14/18 1007 08/14/18 1230 08/14/18 1405  BP: 133/73 122/70 133/79 (!) 151/76  Pulse: 83 81 82 72  Resp:   18 16  Temp: 98.9 F (37.2 C) 98.4 F (36.9 C) 98.2 F (36.8 C) 97.9 F (36.6 C)  TempSrc: Oral Oral Oral Oral  SpO2: 97% 98% 98% 100%  Weight:      Height:        Wt Readings from Last 3 Encounters:  08/14/18 85.6 kg     Intake/Output Summary (Last 24 hours) at 08/14/2018 1509 Last data filed at 08/14/2018 0455 Gross per 24 hour  Intake 100 ml  Output -  Net 100 ml     Physical Exam  Awake Alert, Oriented X 3, has right-sided weakness, chronic  Symmetrical Chest wall movement, Good air movement bilaterally, CTAB RRR,No Gallops,Rubs or new Murmurs, No Parasternal Heave +ve B.Sounds, Abd Soft, No tenderness, No rebound - guarding or rigidity. No Cyanosis, Clubbing or edema, No new Rash or bruise      Data Review:    CBC Recent Labs  Lab 08/13/18 1722 08/13/18 1731  WBC 7.7  --   HGB 11.4* 12.6  HCT 36.0 37.0  PLT 386  --   MCV 85.7  --   MCH 27.1  --   MCHC 31.7  --   RDW 14.0  --   LYMPHSABS 1.9  --   MONOABS 0.6  --   EOSABS 0.2  --   BASOSABS 0.1  --      Chemistries  Recent Labs  Lab 08/13/18 1722 08/13/18 1731  NA 140 140  K 3.6 3.7  CL 106 106  CO2 25  --   GLUCOSE 78 75  BUN 14 14  CREATININE 1.24* 1.30*  CALCIUM 9.5  --   AST 16  --   ALT 19  --   ALKPHOS 63  --   BILITOT 1.0  --    ------------------------------------------------------------------------------------------------------------------ No results for input(s): CHOL, HDL, LDLCALC, TRIG, CHOLHDL, LDLDIRECT in the last 72 hours.  No results found for: HGBA1C ------------------------------------------------------------------------------------------------------------------ No results for input(s): TSH, T4TOTAL, T3FREE, THYROIDAB in the last 72 hours.  Invalid input(s): FREET3 ------------------------------------------------------------------------------------------------------------------ No results for input(s): VITAMINB12, FOLATE, FERRITIN, TIBC, IRON, RETICCTPCT in the last 72 hours.  Coagulation profile Recent Labs  Lab 08/13/18 1722  INR 0.97    No results for input(s): DDIMER in the last 72 hours.  Cardiac Enzymes No results for input(s): CKMB, TROPONINI, MYOGLOBIN in the last 168 hours.  Invalid input(s): CK ------------------------------------------------------------------------------------------------------------------ No results found for: BNP  Inpatient Medications  Scheduled Meds: . aspirin EC  81 mg Oral Daily  . atorvastatin  80 mg Oral q1800  . chlorhexidine  15 mL Mouth Rinse BID  . ezetimibe  10 mg Oral Daily  . [START ON 08/15/2018] gabapentin  600 mg Oral QHS  . insulin aspart  0-9 Units Subcutaneous TID WC  . levETIRAcetam  500 mg Oral BID  . mouth rinse  15 mL Mouth Rinse q12n4p   Continuous Infusions: . sodium chloride  50 mL/hr at 08/14/18 1105   PRN Meds:.acetaminophen **OR** acetaminophen (TYLENOL) oral liquid 160 mg/5 mL **OR** acetaminophen, labetalol, senna-docusate  Micro Results No results found for this or any  previous visit (from the past 240 hour(s)).  Radiology Reports Ct Angio Head W Or Wo Contrast  Result Date: 08/13/2018 CLINICAL DATA:  Arterial stricture/occlusion of the head and neck. Weakness beginning 2 weeks ago. Recent admission for stroke. Episodes of shaking and absence staring. EXAM: CT ANGIOGRAPHY HEAD AND NECK CT PERFUSION BRAIN TECHNIQUE: Multidetector CT imaging of the head and neck was performed using the standard protocol during bolus administration of intravenous contrast. Multiplanar CT image reconstructions and MIPs were obtained to evaluate the vascular anatomy. Carotid stenosis measurements (when applicable) are obtained utilizing NASCET criteria, using the distal internal carotid diameter as the denominator. Multiphase CT imaging of the brain was performed following IV bolus contrast injection. Subsequent parametric perfusion maps were calculated using RAPID software. CONTRAST:  ISOVUE-370 IOPAMIDOL (ISOVUE-370) INJECTION 76% COMPARISON:  MRI brain 08/13/2018 at 4:37 p.m. FINDINGS: CT HEAD FINDINGS Brain: Remote left ACA territory infarct is noted. Additional areas of remote left MCA territory infarcts are present. The areas of more acute infarct are less well delineated on noncontrast CT of the head. The right hemisphere is unremarkable. The brainstem and cerebellum are normal. Vascular: No hyperdense vessel or unexpected calcification. Skull: Calvarium is intact. No focal lytic or blastic lesions are present. Sinuses/Orbits: The paranasal sinuses mastoid air cells are clear. Globes and orbits are within normal limits. ASPECTS (Alberta Stroke Program Early CT Score) - Ganglionic level infarction (caudate, lentiform nuclei, internal capsule, insula, M1-M3 cortex): 7/7 - Supraganglionic infarction (M4-M6 cortex): 3/3 Total score (0-10 with 10 being normal): 10/10 Review of the MIP images confirms the above findings CTA NECK FINDINGS Aortic arch: A three-vessel arch configuration is  present. There is no significant calcification at the aortic arch or stenosis. Right carotid system: Right common carotid artery is within normal limits. Right ICA bifurcation scratched at the right carotid bifurcation is normal. There is tortuosity of the distal right ICA without significant stenosis. Left carotid system: The left common carotid artery is within normal limits. Bifurcation is unremarkable. There is moderate tortuosity of the distal cervical ICA without significant stenosis. Vertebral arteries: The vertebral arteries originate from the subclavian arteries bilaterally. There is a high-grade stenosis of the left vertebral artery at its origin. The right vertebral artery is the dominant vessel. No other focal stenosis or vascular injury is present. Skeleton: Vertebral body heights alignment are maintained. 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 No focal lytic or blastic lesions are present. Hyperostosis is noted. Other neck: Subcentimeter nodules are present within the right lobe of the thyroid. No dominant lesion is present. No follow-up is necessary. No significant cervical adenopathy is present. Salivary glands are within normal limits. No focal mucosal lesions are present. Upper chest: There is dependent atelectasis at the lung apices. Mild interstitial coarsening suggests mild edema. Review of the MIP images confirms the above findings CTA HEAD FINDINGS Anterior circulation: Atherosclerotic changes are present within the cavernous internal carotid arteries bilaterally. The lumen of the right ICA is narrowed to 1.5 mm. This compares to more distal vessel of 3 mm. There is a high-grade stenosis of the paraclinoid left ICA. 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 This compares with 2.6 mm at the bifurcation. More mild atherosclerotic changes are present in the right  M1 segment. Right MCA bifurcation is intact. Left MCA  bifurcation is intact. There is some attenuation of distal MCA branch vessels. Chronic left ACA occlusion is noted. Posterior circulation: The right vertebral artery is the dominant vessel. PICA origins are visualized and normal. Vertebrobasilar junction is normal. Basilar artery is within normal limits. Both posterior cerebral arteries originate from the basilar tip. There is moderate narrowing of the proximal P2 segments bilaterally. Distal PCA branch vessels are intact with distal irregularity. Venous sinuses: Dural sinuses are patent. Straight sinus deep cerebral veins are intact. Cortical veins are within normal limits. Anatomic variants: None Delayed phase: Postcontrast images demonstrate no pathologic enhancement. 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 Review of the MIP images confirms the above findings CT Brain Perfusion Findings: CBF (<30%) Volume: 6mL Perfusion (Tmax>6.0s) volume: 87mL Mismatch Volume: 81mL Infarction Location:Left MCA territory IMPRESSION: 1. 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 2. The area of reduced cerebral blood flow on the CT perfusion corresponds to a remote infarct and is likely artifactual. The area of acute ischemia on the MRI is not demonstrated on the CT perfusion exam. 3. Large area of ischemic penumbra involving the residual left MCA territory, likely secondary to the high-grade stenoses proximally. 4. 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 5. Tortuosity of the cervical internal carotid arteries bilaterally without significant stenosis. 6. 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 Electronically Signed   By: Marin Roberts M.D.   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 Weakness beginning 2 weeks ago.  Recent admission for stroke. Episodes of shaking and absence staring. EXAM: CT ANGIOGRAPHY HEAD AND NECK CT PERFUSION BRAIN TECHNIQUE: Multidetector CT imaging of the head and neck was performed using the standard protocol during bolus administration of intravenous contrast. Multiplanar CT image reconstructions and MIPs were obtained to evaluate the vascular anatomy. Carotid stenosis measurements (when applicable) are obtained utilizing NASCET criteria, using the distal internal carotid diameter as the denominator. Multiphase CT imaging of the brain was performed following IV bolus contrast injection. Subsequent parametric perfusion maps were calculated using RAPID software. CONTRAST:  ISOVUE-370 IOPAMIDOL (ISOVUE-370) INJECTION 76% COMPARISON:  MRI brain 08/13/2018 at 4:37 p.m. FINDINGS: CT HEAD FINDINGS Brain: Remote left ACA territory infarct is noted. Additional areas of remote left MCA territory infarcts are present. The areas of more acute infarct are less well delineated on noncontrast CT of the head. The right hemisphere is unremarkable. The brainstem and cerebellum are normal. Vascular: No hyperdense vessel or unexpected calcification. Skull: Calvarium is intact. No focal lytic or blastic lesions are present. Sinuses/Orbits: The paranasal sinuses mastoid air cells are clear. Globes and orbits are within normal limits. ASPECTS (Alberta Stroke Program Early CT Score) - Ganglionic level infarction (caudate, lentiform nuclei, internal capsule, insula, M1-M3 cortex): 7/7 - Supraganglionic infarction (M4-M6 cortex): 3/3 Total score (0-10 with 10 being normal): 10/10 Review of the MIP images confirms the above findings CTA NECK FINDINGS Aortic arch: A three-vessel arch configuration is present. There is no significant calcification at the aortic arch or stenosis. Right carotid system: Right common carotid artery is within normal limits. Right ICA bifurcation scratched at the right carotid bifurcation is  normal. There is tortuosity of the distal right ICA without significant stenosis. Left carotid system: The  left common carotid artery is within normal limits. Bifurcation is unremarkable. There is moderate tortuosity of the distal cervical ICA without significant stenosis. Vertebral arteries: The vertebral arteries originate from the subclavian arteries bilaterally. There is a high-grade stenosis of the left vertebral artery at its origin. The right vertebral artery is the dominant vessel. No other focal stenosis or vascular injury is present. Skeleton: Vertebral body heights alignment are maintained. 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 No focal lytic or blastic lesions are present. Hyperostosis is noted. Other neck: Subcentimeter nodules are present within the right lobe of the thyroid. No dominant lesion is present. No follow-up is necessary. No significant cervical adenopathy is present. Salivary glands are within normal limits. No focal mucosal lesions are present. Upper chest: There is dependent atelectasis at the lung apices. Mild interstitial coarsening suggests mild edema. Review of the MIP images confirms the above findings CTA HEAD FINDINGS Anterior circulation: Atherosclerotic changes are present within the cavernous internal carotid arteries bilaterally. The lumen of the right ICA is narrowed to 1.5 mm. This compares to more distal vessel of 3 mm. There is a high-grade stenosis of the paraclinoid left ICA. 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 This compares with 2.6 mm at the bifurcation. More mild atherosclerotic changes are present in the right M1 segment. Right MCA bifurcation is intact. Left MCA bifurcation is intact. There is some attenuation of distal MCA branch vessels. Chronic left ACA occlusion is noted. Posterior circulation: The right vertebral artery is the dominant vessel. PICA origins are visualized and  normal. Vertebrobasilar junction is normal. Basilar artery is within normal limits. Both posterior cerebral arteries originate from the basilar tip. There is moderate narrowing of the proximal P2 segments bilaterally. Distal PCA branch vessels are intact with distal irregularity. Venous sinuses: Dural sinuses are patent. Straight sinus deep cerebral veins are intact. Cortical veins are within normal limits. Anatomic variants: None Delayed phase: Postcontrast images demonstrate no pathologic enhancement. 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 Review of the MIP images confirms the above findings CT Brain Perfusion Findings: CBF (<30%) Volume: 6mL Perfusion (Tmax>6.0s) volume: 87mL Mismatch Volume: 81mL Infarction Location:Left MCA territory IMPRESSION: 1. 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 2. The area of reduced cerebral blood flow on the CT perfusion corresponds to a remote infarct and is likely artifactual. The area of acute ischemia on the MRI is not demonstrated on the CT perfusion exam. 3. Large area of ischemic penumbra involving the residual left MCA territory, likely secondary to the high-grade stenoses proximally. 4. 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 5. Tortuosity of the cervical internal carotid arteries bilaterally without significant stenosis. 6. 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 Electronically Signed   By: Marin Roberts M.D.   On: 08/13/2018 19:29   Mr Brain Wo Contrast (neuro Protocol)  Result Date: 08/13/2018 CLINICAL DATA:  Seizure like episodes for 2 weeks. History of stroke, hypertension and diabetes. EXAM: MRI HEAD WITHOUT CONTRAST TECHNIQUE: Multiplanar, multiecho pulse sequences of the brain and surrounding structures were obtained without intravenous contrast. COMPARISON:  None. FINDINGS: Mild motion degraded  examination. INTRACRANIAL CONTENTS: Patchy LEFT parietal reduced diffusion with nearly normalized ADC values and bright FLAIR signal. Spurious reduced diffusion mesial LEFT frontoparietal lobes associated with confluent encephalomalacia and susceptibility artifact. Ex vacuo dilatation LEFT  lateral ventricle. Patchy LEFT parietooccipital lobe encephalomalacia. Smaller T2 bright LEFT cerebral peduncle and patchy LEFT pontine T2 hyperintense signal consistent with wallerian degeneration. Patchy supratentorial white matter FLAIR T2 hyperintensities. No susceptibility artifact to suggest hemorrhage. The ventricles and sulci are normal for patient's age. No suspicious parenchymal signal, masses, mass effect. No abnormal extra-axial fluid collections. No extra-axial masses. VASCULAR: Normal major intracranial vascular flow voids present at skull base. SKULL AND UPPER CERVICAL SPINE: No abnormal sellar expansion. No suspicious calvarial bone marrow signal. Craniocervical junction maintained. SINUSES/ORBITS: The mastoid air-cells and included paranasal sinuses are well-aerated.The included ocular globes and orbital contents are non-suspicious. OTHER: None. IMPRESSION: 1. Mild motion degraded examination. Acute to subacute LEFT parietal/MCA territory infarct. 2. 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 3. Mild-to-moderate chronic small vessel ischemic changes. Electronically Signed   By: Awilda Metro M.D.   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 Weakness beginning 2 weeks ago. Recent admission for stroke. Episodes of shaking and absence staring. EXAM: CT ANGIOGRAPHY HEAD AND NECK CT PERFUSION BRAIN TECHNIQUE: Multidetector CT imaging of the head and neck was performed using the standard protocol during bolus administration of intravenous contrast. Multiplanar CT image  reconstructions and MIPs were obtained to evaluate the vascular anatomy. Carotid stenosis measurements (when applicable) are obtained utilizing NASCET criteria, using the distal internal carotid diameter as the denominator. Multiphase CT imaging of the brain was performed following IV bolus contrast injection. Subsequent parametric perfusion maps were calculated using RAPID software. CONTRAST:  ISOVUE-370 IOPAMIDOL (ISOVUE-370) INJECTION 76% COMPARISON:  MRI brain 08/13/2018 at 4:37 p.m. FINDINGS: CT HEAD FINDINGS Brain: Remote left ACA territory infarct is noted. Additional areas of remote left MCA territory infarcts are present. The areas of more acute infarct are less well delineated on noncontrast CT of the head. The right hemisphere is unremarkable. The brainstem and cerebellum are normal. Vascular: No hyperdense vessel or unexpected calcification. Skull: Calvarium is intact. No focal lytic or blastic lesions are present. Sinuses/Orbits: The paranasal sinuses mastoid air cells are clear. Globes and orbits are within normal limits. ASPECTS (Alberta Stroke Program Early CT Score) - Ganglionic level infarction (caudate, lentiform nuclei, internal capsule, insula, M1-M3 cortex): 7/7 - Supraganglionic infarction (M4-M6 cortex): 3/3 Total score (0-10 with 10 being normal): 10/10 Review of the MIP images confirms the above findings CTA NECK FINDINGS Aortic arch: A three-vessel arch configuration is present. There is no significant calcification at the aortic arch or stenosis. Right carotid system: Right common carotid artery is within normal limits. Right ICA bifurcation scratched at the right carotid bifurcation is normal. There is tortuosity of the distal right ICA without significant stenosis. Left carotid system: The left common carotid artery is within normal limits. Bifurcation is unremarkable. There is moderate tortuosity of the distal cervical ICA without significant stenosis. Vertebral arteries: The  vertebral arteries originate from the subclavian arteries bilaterally. There is a high-grade stenosis of the left vertebral artery at its origin. The right vertebral artery is the dominant vessel. No other focal stenosis or vascular injury is present. Skeleton: Vertebral body heights alignment are maintained. 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 No focal lytic or blastic lesions are present. Hyperostosis is noted. Other neck: Subcentimeter nodules are present within the right lobe of the thyroid. No dominant lesion is present. No follow-up is necessary. No significant cervical adenopathy is present.  Salivary glands are within normal limits. No focal mucosal lesions are present. Upper chest: There is dependent atelectasis at the lung apices. Mild interstitial coarsening suggests mild edema. Review of the MIP images confirms the above findings CTA HEAD FINDINGS Anterior circulation: Atherosclerotic changes are present within the cavernous internal carotid arteries bilaterally. The lumen of the right ICA is narrowed to 1.5 mm. This compares to more distal vessel of 3 mm. There is a high-grade stenosis of the paraclinoid left ICA. 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 This compares with 2.6 mm at the bifurcation. More mild atherosclerotic changes are present in the right M1 segment. Right MCA bifurcation is intact. Left MCA bifurcation is intact. There is some attenuation of distal MCA branch vessels. Chronic left ACA occlusion is noted. Posterior circulation: The right vertebral artery is the dominant vessel. PICA origins are visualized and normal. Vertebrobasilar junction is normal. Basilar artery is within normal limits. Both posterior cerebral arteries originate from the basilar tip. There is moderate narrowing of the proximal P2 segments bilaterally. Distal PCA branch vessels are intact with distal irregularity. Venous sinuses:  Dural sinuses are patent. Straight sinus deep cerebral veins are intact. Cortical veins are within normal limits. Anatomic variants: None Delayed phase: Postcontrast images demonstrate no pathologic enhancement. 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 Review of the MIP images confirms the above findings CT Brain Perfusion Findings: CBF (<30%) Volume: 6mL Perfusion (Tmax>6.0s) volume: 87mL Mismatch Volume: 81mL Infarction Location:Left MCA territory IMPRESSION: 1. 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 2. The area of reduced cerebral blood flow on the CT perfusion corresponds to a remote infarct and is likely artifactual. The area of acute ischemia on the MRI is not demonstrated on the CT perfusion exam. 3. Large area of ischemic penumbra involving the residual left MCA territory, likely secondary to the high-grade stenoses proximally. 4. 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 5. Tortuosity of the cervical internal carotid arteries bilaterally without significant stenosis. 6. 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 Electronically Signed   By: Marin Robertshristopher  Mattern M.D.   On: 08/13/2018 19:29     Huey Bienenstockawood Diannah Rindfleisch M.D on 08/14/2018 at 3:09 PM  Between 7am to 7pm - Pager - [phone]  After 7pm go to [email or web address] - password [email or web address]  [facility] -  Office  [phone]

## 2018-08-14 NOTE — Evaluation (Signed)
Physical Therapy Evaluation Patient Details Name: Julia Ferguson MRN: [id number] DOB: [birth year] Today's Date: 08/14/2018   History of Present Illness  47yo female brought to the ED due to spasm/jerking R UE, twitching and turning face to L, staring of 1 week duration, slurred speech and R sided weakness. No LOC. 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 No tpa given. PMH DM, HTN, CVA with R residual deficits   Clinical Impression   Patient received in bed with mother and father present, who assisted in providing history and PLOF/equipment; of note, patient has 24/7 assistance from family, severe bilateral neuropathy, sometimes uses AFO, and was just starting home health PT before she was admitted to hospital. She is able to perform bed mobility with MinA, however had some dizziness/visual blurring sitting up in bed which resolved with extended sitting; able to stand with RW and ModA and ambulate 55f forward and backward with RW and min guard, gait limited by return of dizziness and visual blurring which resolved with return to supine. RN aware of symptoms, therapy will also plan to get orthostatics next time patient is seen by our staff. Per family, she is weaker than usual but gait is generally at baseline. She was left in bed with all needs met, HOB elevated, bed alarm active and nurse tech present and attending. She will continue to benefit from skilled PT services in the acute setting as well as skilled HHPT services moving forward.     Follow Up Recommendations Home health PT;Supervision/Assistance - 24 hour    Equipment Recommendations  None recommended by PT    Recommendations for Other Services       Precautions / Restrictions Precautions Precautions: Fall;Other (comment) Precaution Comments: R residual deficits from previous CVA, foot drop, severe  neuropathy B feet  Restrictions Weight Bearing Restrictions: No      Mobility  Bed Mobility Overal bed mobility: Needs Assistance Bed Mobility: Supine to Sit;Sit to Supine     Supine to sit: Min assist Sit to supine: Min assist   General bed mobility comments: MinA to scoot hips around and power up trunk; MinA for LE management with return to bed   Transfers Overall transfer level: Needs assistance Equipment used: Rolling walker (2 wheeled) Transfers: Sit to/from Stand Sit to Stand: Mod assist         General transfer comment: ModA to power up to full standing position   Ambulation/Gait Ambulation/Gait assistance: Min guard Gait Distance (Feet): 3 Feet Assistive device: Rolling walker (2 wheeled) Gait Pattern/deviations: Step-to pattern;Decreased step length - left;Decreased stance time - right;Decreased dorsiflexion - right;Decreased dorsiflexion - left;Decreased weight shift to right;Trunk flexed;Narrow base of support Gait velocity: decreased    General Gait Details: short step/stride lengths and limited by dizziness, family reports gait in general with excpetion of dizziness is at her baseline   SFinancial traderRankin (Stroke Patients Only)       Balance Overall balance assessment: Needs assistance Sitting-balance support: Bilateral upper extremity supported;Feet supported Sitting balance-Leahy Scale: Good     Standing balance support: Bilateral upper extremity supported;During functional activity Standing balance-Leahy Scale: Fair Standing balance comment: reliance on B UE support  Pertinent Vitals/Pain Pain Assessment: No/denies pain    Home Living Family/patient expects to be discharged to:: Private residence Living Arrangements: Other relatives;Parent(parents, aunts, children ) Available Help at Discharge: Family;Available 24 hours/day Type of Home: House Home Access:  Ramped entrance     Home Layout: Two level;Able to live on main level with bedroom/bathroom Home Equipment: Gilford Rile - 2 wheels;Cane - single point;Toilet riser;Wheelchair - manual      Prior Function Level of Independence: Needs assistance   Gait / Transfers Assistance Needed: stand by assist from family   ADL's / Homemaking Assistance Needed: needs help bathing/dressing         Hand Dominance        Extremity/Trunk Assessment   Upper Extremity Assessment Upper Extremity Assessment: Defer to OT evaluation    Lower Extremity Assessment Lower Extremity Assessment: Generalized weakness;RLE deficits/detail;LLE deficits/detail RLE Deficits / Details: residual weakness/lack of coordination due to old CVAs  RLE Sensation: decreased proprioception;history of peripheral neuropathy RLE Coordination: decreased gross motor;decreased fine motor LLE Deficits / Details: gross weakness  LLE Sensation: history of peripheral neuropathy;decreased proprioception LLE Coordination: WNL    Cervical / Trunk Assessment Cervical / Trunk Assessment: Kyphotic  Communication   Communication: Expressive difficulties  Cognition Arousal/Alertness: Awake/alert Behavior During Therapy: WFL for tasks assessed/performed Overall Cognitive Status: Within Functional Limits for tasks assessed                                        General Comments      Exercises     Assessment/Plan    PT Assessment Patient needs continued PT services  PT Problem List Decreased strength;Decreased mobility;Decreased safety awareness;Decreased coordination;Decreased activity tolerance;Decreased balance       PT Treatment Interventions DME instruction;Therapeutic activities;Gait training;Therapeutic exercise;Patient/family education;Stair training;Balance training;Functional mobility training;Neuromuscular re-education    PT Goals (Current goals can be found in the Care Plan section)  Acute Rehab PT  Goals Patient Stated Goal: go home, feel better  PT Goal Formulation: With patient/family Time For Goal Achievement: 08/28/18 Potential to Achieve Goals: Good    Frequency Min 3X/week   Barriers to discharge        Co-evaluation               AM-PAC PT "6 Clicks" Daily Activity  Outcome Measure Difficulty turning over in bed (including adjusting bedclothes, sheets and blankets)?: A Little Difficulty moving from lying on back to sitting on the side of the bed? : A Little Difficulty sitting down on and standing up from a chair with arms (e.g., wheelchair, bedside commode, etc,.)?: A Little Help needed moving to and from a bed to chair (including a wheelchair)?: A Little Help needed walking in hospital room?: A Little Help needed climbing 3-5 steps with a railing? : A Lot 6 Click Score: 17    End of Session Equipment Utilized During Treatment: Gait belt Activity Tolerance: Other (comment)(limited by dizziness ) Patient left: in bed;with bed alarm set;with call bell/phone within reach;with nursing/sitter in room;with family/visitor present Nurse Communication: Mobility status;Other (comment)(limited by dizziness ) PT Visit Diagnosis: Unsteadiness on feet (R26.81);Muscle weakness (generalized) (M62.81);Other abnormalities of gait and mobility (R26.89)    Time: 5573-2202 PT Time Calculation (min) (ACUTE ONLY): 25 min   Charges:   PT Evaluation $PT Eval Moderate Complexity: 1 Mod PT Treatments $Therapeutic Activity: 8-22 mins        Deniece Ree PT, DPT,  CBIS  Supplemental Physical Therapist [place]    Pager [phone] Acute Rehab Office [phone]

## 2018-08-14 NOTE — Progress Notes (Signed)
Pt [facility] from [facility] via [facility]. Pt is A&Ox2. Pt lives home with her son. Pt's rt arm is weak and slightly contracted. Pt placed on telemetry box #22. Paged admitting that pt is [facility] and notified neurologist Dr. Wilford CornerArora that pt is here. Pt's skin is warm, dry and intact. Oriented pt to room and how to use call bell. Told pt to call for assistance before getting up, pt stated understanding. Will continue to monitor pt. Nelda MarseilleJenny Thacker, RN

## 2018-08-14 NOTE — Consult Note (Signed)
Chief Complaint: Patient was seen in consultation today for acute ischemic stroke.  Referring Physician(s): Rinehuls, Kinnie Scales  Supervising Physician: Julieanne Cotton  Patient Status: [facility] - In-pt  History of Present Illness: Julia Ferguson is a 47 y.o. 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 On 08/07/2018, she was admitted to [facility] for recurrent stroke. She was discharged home 08/08/2018. She presented to [facility] ED 08/13/2018 after being referred by her PCP for symptoms of right arm jerking/weakness, absent staring, and transient worsening of speech. Upon evaluation, she was found to have multiple areas of intracranial stenosis, and she was then transferred to [facility] for further evaluation.  MRI brain 08/13/2018: 1. Mild motion degraded examination. Acute to subacute LEFT parietal/MCA territory infarct. 2. 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 3. Mild-to-moderate chronic small vessel ischemic changes.  CTA head/neck and CT cerebral perfusion 08/13/2018: 1. 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 2. The area of reduced cerebral blood flow on the CT perfusion corresponds to a remote infarct and is likely artifactual. The area of acute ischemia on the MRI is not demonstrated on the CT perfusion exam. 3. Large area of ischemic penumbra involving the residual left MCA territory, likely secondary to the high-grade stenoses proximally. 4. 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 5. Tortuosity of the cervical internal carotid arteries bilaterally without significant stenosis. 6. 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  IR requested by Delton See for possible image-guided diagnostic cerebral angiogram. Patient awake and alert sitting in chair. Accompanied by mother  and father at bedside. Complains of LUE weakness, stable at this time. Complains of intermittent dizziness. Denies syncope associated with dizziness. Complains of intermittent diplopia. Denies fever, chills, chest pain, dyspnea, abdominal pain, headache, numbness/tingling, blurred vision, hearing changes, tinnitus, or speech difficulty.  Patient is currently taking Plavix 75 mg once daily.   Past Medical History:  Diagnosis Date  . Diabetes mellitus without complication (HCC)   . Hypertension   . Stroke [facility])     Past Surgical History:  Procedure Laterality Date  . ABDOMINAL HYSTERECTOMY    . CESAREAN SECTION      Allergies: Patient has no known allergies.  Medications: Prior to Admission medications   Medication Sig Start Date End Date Taking? Authorizing Provider  amLODipine (NORVASC) 10 MG tablet Take by mouth.   Yes [provider]  aspirin EC 81 MG tablet Take by mouth.   Yes [provider]  atorvastatin (LIPITOR) 80 MG tablet Take by mouth.   Yes [provider]  carvedilol (COREG) 25 MG tablet Take by mouth.   Yes [provider]  clopidogrel (PLAVIX) 75 MG tablet Take by mouth.   Yes [provider]  ezetimibe (ZETIA) 10 MG tablet  08/12/18  Yes [provider]  gabapentin (NEURONTIN) 600 MG tablet Take by mouth.   Yes [provider]  glimepiride (AMARYL) 2 MG tablet  08/11/18  Yes [provider]  Insulin Glargine, 2 Unit Dial, (TOUJEO MAX SOLOSTAR) 300 UNIT/ML SOPN Inject into the skin.   Yes [provider]  losartan (COZAAR) 100 MG tablet Take 100 mg by mouth daily.   Yes [provider]  minoxidil (LONITEN) 2.5 MG tablet Take by mouth.   Yes [provider]  traMADol (ULTRAM) 50 MG tablet Take by mouth.  Yes [provider]     Family History  Problem Relation Age of Onset  . Hypertension Other   . Kidney failure Other     Social History    Socioeconomic History  . Marital status: Divorced    Spouse name: Not on file  . Number of children: Not on file  . Years of education: Not on file  . Highest education level: Not on file  Occupational History  . Not on file  Social Needs  . Financial resource strain: Not on file  . Food insecurity:    Worry: Not on file    Inability: Not on file  . Transportation needs:    Medical: Not on file    Non-medical: Not on file  Tobacco Use  . Smoking status: Never Smoker  . Smokeless tobacco: Never Used  Substance and Sexual Activity  . Alcohol use: Never    Frequency: Never  . Drug use: Never  . Sexual activity: Not on file  Lifestyle  . Physical activity:    Days per week: Not on file    Minutes per session: Not on file  . Stress: Not on file  Relationships  . Social connections:    Talks on phone: Not on file    Gets together: Not on file    Attends religious service: Not on file    Active member of club or organization: Not on file    Attends meetings of clubs or organizations: Not on file    Relationship status: Not on file  Other Topics Concern  . Not on file  Social History Narrative  . Not on file     Review of Systems: A 12 point ROS discussed and pertinent positives are indicated in the HPI above.  All other systems are negative.  Review of Systems  Constitutional: Negative for chills and fever.  HENT: Negative for hearing loss and tinnitus.   Eyes: Positive for visual disturbance.  Respiratory: Negative for shortness of breath and wheezing.   Cardiovascular: Negative for chest pain and palpitations.  Gastrointestinal: Negative for abdominal pain.  Neurological: Positive for dizziness and weakness. Negative for syncope, speech difficulty, numbness and headaches.  Psychiatric/Behavioral: Negative for behavioral problems and confusion.    Vital Signs: BP 122/70 (BP Location: Left Arm)   Pulse 81   Temp 98.4 F (36.9 C) (Oral)   Resp 18   Ht 5\' 2"   (1.575 m)   Wt 188 lb 11.4 oz (85.6 kg)   SpO2 98%   BMI 34.52 kg/m   Physical Exam  Constitutional: She is oriented to person, place, and time. She appears well-developed and well-nourished. No distress.  Cardiovascular: Normal rate, regular rhythm and normal heart sounds.  No murmur heard. Pulmonary/Chest: Effort normal and breath sounds normal. No respiratory distress. She has no wheezes.  Neurological: She is alert and oriented to person, place, and time.  Skin: Skin is warm and dry.  Psychiatric: She has a normal mood and affect. Her behavior is normal. Judgment and thought content normal.  Nursing note and vitals reviewed.    MD Evaluation Airway: WNL Heart: WNL Abdomen: WNL Chest/ Lungs: WNL ASA  Classification: 2 Mallampati/Airway Score: Two   Imaging: Ct Angio Head W Or Wo Contrast  Result Date: 08/13/2018 CLINICAL DATA:  Arterial stricture/occlusion of the head and neck. Weakness beginning 2 weeks ago. Recent admission for stroke. Episodes of shaking and absence staring. EXAM: CT ANGIOGRAPHY HEAD AND NECK CT PERFUSION BRAIN TECHNIQUE: Multidetector CT  imaging of the head and neck was performed using the standard protocol during bolus administration of intravenous contrast. Multiplanar CT image reconstructions and MIPs were obtained to evaluate the vascular anatomy. Carotid stenosis measurements (when applicable) are obtained utilizing NASCET criteria, using the distal internal carotid diameter as the denominator. Multiphase CT imaging of the brain was performed following IV bolus contrast injection. Subsequent parametric perfusion maps were calculated using RAPID software. CONTRAST:  ISOVUE-370 IOPAMIDOL (ISOVUE-370) INJECTION 76% COMPARISON:  MRI brain 08/13/2018 at 4:37 p.m. FINDINGS: CT HEAD FINDINGS Brain: Remote left ACA territory infarct is noted. Additional areas of remote left MCA territory infarcts are present. The areas of more acute infarct are less well  delineated on noncontrast CT of the head. The right hemisphere is unremarkable. The brainstem and cerebellum are normal. Vascular: No hyperdense vessel or unexpected calcification. Skull: Calvarium is intact. No focal lytic or blastic lesions are present. Sinuses/Orbits: The paranasal sinuses mastoid air cells are clear. Globes and orbits are within normal limits. ASPECTS (Alberta Stroke Program Early CT Score) - Ganglionic level infarction (caudate, lentiform nuclei, internal capsule, insula, M1-M3 cortex): 7/7 - Supraganglionic infarction (M4-M6 cortex): 3/3 Total score (0-10 with 10 being normal): 10/10 Review of the MIP images confirms the above findings CTA NECK FINDINGS Aortic arch: A three-vessel arch configuration is present. There is no significant calcification at the aortic arch or stenosis. Right carotid system: Right common carotid artery is within normal limits. Right ICA bifurcation scratched at the right carotid bifurcation is normal. There is tortuosity of the distal right ICA without significant stenosis. Left carotid system: The left common carotid artery is within normal limits. Bifurcation is unremarkable. There is moderate tortuosity of the distal cervical ICA without significant stenosis. Vertebral arteries: The vertebral arteries originate from the subclavian arteries bilaterally. There is a high-grade stenosis of the left vertebral artery at its origin. The right vertebral artery is the dominant vessel. No other focal stenosis or vascular injury is present. Skeleton: Vertebral body heights alignment are maintained. 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 No focal lytic or blastic lesions are present. Hyperostosis is noted. Other neck: Subcentimeter nodules are present within the right lobe of the thyroid. No dominant lesion is present. No follow-up is necessary. No significant cervical adenopathy is present. Salivary glands are within normal limits. No  focal mucosal lesions are present. Upper chest: There is dependent atelectasis at the lung apices. Mild interstitial coarsening suggests mild edema. Review of the MIP images confirms the above findings CTA HEAD FINDINGS Anterior circulation: Atherosclerotic changes are present within the cavernous internal carotid arteries bilaterally. The lumen of the right ICA is narrowed to 1.5 mm. This compares to more distal vessel of 3 mm. There is a high-grade stenosis of the paraclinoid left ICA. 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 This compares with 2.6 mm at the bifurcation. More mild atherosclerotic changes are present in the right M1 segment. Right MCA bifurcation is intact. Left MCA bifurcation is intact. There is some attenuation of distal MCA branch vessels. Chronic left ACA occlusion is noted. Posterior circulation: The right vertebral artery is the dominant vessel. PICA origins are visualized and normal. Vertebrobasilar junction is normal. Basilar artery is within normal limits. Both posterior cerebral arteries originate from the basilar tip. There is moderate narrowing of the proximal P2 segments bilaterally. Distal PCA branch vessels are intact with distal irregularity. Venous sinuses: Dural sinuses are patent. Straight sinus  deep cerebral veins are intact. Cortical veins are within normal limits. Anatomic variants: None Delayed phase: Postcontrast images demonstrate no pathologic enhancement. 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 Review of the MIP images confirms the above findings CT Brain Perfusion Findings: CBF (<30%) Volume: 6mL Perfusion (Tmax>6.0s) volume: 87mL Mismatch Volume: 81mL Infarction Location:Left MCA territory IMPRESSION: 1. 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 2. The area of reduced cerebral blood flow on the CT perfusion corresponds to a remote infarct and  is likely artifactual. The area of acute ischemia on the MRI is not demonstrated on the CT perfusion exam. 3. Large area of ischemic penumbra involving the residual left MCA territory, likely secondary to the high-grade stenoses proximally. 4. 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 5. Tortuosity of the cervical internal carotid arteries bilaterally without significant stenosis. 6. 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 Electronically Signed   By: Marin Roberts M.D.   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 Weakness beginning 2 weeks ago. Recent admission for stroke. Episodes of shaking and absence staring. EXAM: CT ANGIOGRAPHY HEAD AND NECK CT PERFUSION BRAIN TECHNIQUE: Multidetector CT imaging of the head and neck was performed using the standard protocol during bolus administration of intravenous contrast. Multiplanar CT image reconstructions and MIPs were obtained to evaluate the vascular anatomy. Carotid stenosis measurements (when applicable) are obtained utilizing NASCET criteria, using the distal internal carotid diameter as the denominator. Multiphase CT imaging of the brain was performed following IV bolus contrast injection. Subsequent parametric perfusion maps were calculated using RAPID software. CONTRAST:  ISOVUE-370 IOPAMIDOL (ISOVUE-370) INJECTION 76% COMPARISON:  MRI brain 08/13/2018 at 4:37 p.m. FINDINGS: CT HEAD FINDINGS Brain: Remote left ACA territory infarct is noted. Additional areas of remote left MCA territory infarcts are present. The areas of more acute infarct are less well delineated on noncontrast CT of the head. The right hemisphere is unremarkable. The brainstem and cerebellum are normal. Vascular: No hyperdense vessel or unexpected calcification. Skull: Calvarium is intact. No focal lytic or  blastic lesions are present. Sinuses/Orbits: The paranasal sinuses mastoid air cells are clear. Globes and orbits are within normal limits. ASPECTS (Alberta Stroke Program Early CT Score) - Ganglionic level infarction (caudate, lentiform nuclei, internal capsule, insula, M1-M3 cortex): 7/7 - Supraganglionic infarction (M4-M6 cortex): 3/3 Total score (0-10 with 10 being normal): 10/10 Review of the MIP images confirms the above findings CTA NECK FINDINGS Aortic arch: A three-vessel arch configuration is present. There is no significant calcification at the aortic arch or stenosis. Right carotid system: Right common carotid artery is within normal limits. Right ICA bifurcation scratched at the right carotid bifurcation is normal. There is tortuosity of the distal right ICA without significant stenosis. Left carotid system: The left common carotid artery is within normal limits. Bifurcation is unremarkable. There is moderate tortuosity of the distal cervical ICA without significant stenosis. Vertebral arteries: The vertebral arteries originate from the subclavian arteries bilaterally. There is a high-grade stenosis of the left vertebral artery at its origin. The right vertebral artery is the dominant vessel. No other focal stenosis or vascular injury is present. Skeleton: Vertebral body heights alignment are maintained. 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 No focal lytic or blastic lesions are present. Hyperostosis  is noted. Other neck: Subcentimeter nodules are present within the right lobe of the thyroid. No dominant lesion is present. No follow-up is necessary. No significant cervical adenopathy is present. Salivary glands are within normal limits. No focal mucosal lesions are present. Upper chest: There is dependent atelectasis at the lung apices. Mild interstitial coarsening suggests mild edema. Review of the MIP images confirms the above findings CTA HEAD FINDINGS Anterior  circulation: Atherosclerotic changes are present within the cavernous internal carotid arteries bilaterally. The lumen of the right ICA is narrowed to 1.5 mm. This compares to more distal vessel of 3 mm. There is a high-grade stenosis of the paraclinoid left ICA. 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 This compares with 2.6 mm at the bifurcation. More mild atherosclerotic changes are present in the right M1 segment. Right MCA bifurcation is intact. Left MCA bifurcation is intact. There is some attenuation of distal MCA branch vessels. Chronic left ACA occlusion is noted. Posterior circulation: The right vertebral artery is the dominant vessel. PICA origins are visualized and normal. Vertebrobasilar junction is normal. Basilar artery is within normal limits. Both posterior cerebral arteries originate from the basilar tip. There is moderate narrowing of the proximal P2 segments bilaterally. Distal PCA branch vessels are intact with distal irregularity. Venous sinuses: Dural sinuses are patent. Straight sinus deep cerebral veins are intact. Cortical veins are within normal limits. Anatomic variants: None Delayed phase: Postcontrast images demonstrate no pathologic enhancement. 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 Review of the MIP images confirms the above findings CT Brain Perfusion Findings: CBF (<30%) Volume: 6mL Perfusion (Tmax>6.0s) volume: 87mL Mismatch Volume: 81mL Infarction Location:Left MCA territory IMPRESSION: 1. 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 2. The area of reduced cerebral blood flow on the CT perfusion corresponds to a remote infarct and is likely artifactual. The area of acute ischemia on the MRI is not demonstrated on the CT perfusion exam. 3. Large area of ischemic penumbra involving the residual left MCA territory, likely secondary to the high-grade stenoses  proximally. 4. 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 5. Tortuosity of the cervical internal carotid arteries bilaterally without significant stenosis. 6. 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 Electronically Signed   By: Marin Roberts M.D.   On: 08/13/2018 19:29   Mr Brain Wo Contrast (neuro Protocol)  Result Date: 08/13/2018 CLINICAL DATA:  Seizure like episodes for 2 weeks. History of stroke, hypertension and diabetes. EXAM: MRI HEAD WITHOUT CONTRAST TECHNIQUE: Multiplanar, multiecho pulse sequences of the brain and surrounding structures were obtained without intravenous contrast. COMPARISON:  None. FINDINGS: Mild motion degraded examination. INTRACRANIAL CONTENTS: Patchy LEFT parietal reduced diffusion with nearly normalized ADC values and bright FLAIR signal. Spurious reduced diffusion mesial LEFT frontoparietal lobes associated with confluent encephalomalacia and susceptibility artifact. Ex vacuo dilatation LEFT lateral ventricle. Patchy LEFT parietooccipital lobe encephalomalacia. Smaller T2 bright LEFT cerebral peduncle and patchy LEFT pontine T2 hyperintense signal consistent with wallerian degeneration. Patchy supratentorial white matter FLAIR T2 hyperintensities. No susceptibility artifact to suggest hemorrhage. The ventricles and sulci are normal for patient's age. No suspicious parenchymal signal, masses, mass effect. No abnormal extra-axial fluid collections. No extra-axial masses. VASCULAR: Normal major intracranial vascular flow voids present at skull base. SKULL AND UPPER CERVICAL SPINE: No abnormal sellar expansion. No suspicious calvarial bone marrow signal. Craniocervical junction maintained.  SINUSES/ORBITS: The mastoid air-cells and included paranasal sinuses are well-aerated.The included ocular globes and orbital contents are non-suspicious. OTHER: None. IMPRESSION: 1. Mild motion degraded examination. Acute to  subacute LEFT parietal/MCA territory infarct. 2. 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 3. Mild-to-moderate chronic small vessel ischemic changes. Electronically Signed   By: Awilda Metro M.D.   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 Weakness beginning 2 weeks ago. Recent admission for stroke. Episodes of shaking and absence staring. EXAM: CT ANGIOGRAPHY HEAD AND NECK CT PERFUSION BRAIN TECHNIQUE: Multidetector CT imaging of the head and neck was performed using the standard protocol during bolus administration of intravenous contrast. Multiplanar CT image reconstructions and MIPs were obtained to evaluate the vascular anatomy. Carotid stenosis measurements (when applicable) are obtained utilizing NASCET criteria, using the distal internal carotid diameter as the denominator. Multiphase CT imaging of the brain was performed following IV bolus contrast injection. Subsequent parametric perfusion maps were calculated using RAPID software. CONTRAST:  ISOVUE-370 IOPAMIDOL (ISOVUE-370) INJECTION 76% COMPARISON:  MRI brain 08/13/2018 at 4:37 p.m. FINDINGS: CT HEAD FINDINGS Brain: Remote left ACA territory infarct is noted. Additional areas of remote left MCA territory infarcts are present. The areas of more acute infarct are less well delineated on noncontrast CT of the head. The right hemisphere is unremarkable. The brainstem and cerebellum are normal. Vascular: No hyperdense vessel or unexpected calcification. Skull: Calvarium is intact. No focal lytic or blastic lesions are present. Sinuses/Orbits: The paranasal sinuses mastoid air cells are clear. Globes and orbits are within normal limits. ASPECTS (Alberta Stroke Program Early CT Score) - Ganglionic level infarction (caudate, lentiform nuclei, internal capsule, insula, M1-M3 cortex): 7/7 - Supraganglionic infarction  (M4-M6 cortex): 3/3 Total score (0-10 with 10 being normal): 10/10 Review of the MIP images confirms the above findings CTA NECK FINDINGS Aortic arch: A three-vessel arch configuration is present. There is no significant calcification at the aortic arch or stenosis. Right carotid system: Right common carotid artery is within normal limits. Right ICA bifurcation scratched at the right carotid bifurcation is normal. There is tortuosity of the distal right ICA without significant stenosis. Left carotid system: The left common carotid artery is within normal limits. Bifurcation is unremarkable. There is moderate tortuosity of the distal cervical ICA without significant stenosis. Vertebral arteries: The vertebral arteries originate from the subclavian arteries bilaterally. There is a high-grade stenosis of the left vertebral artery at its origin. The right vertebral artery is the dominant vessel. No other focal stenosis or vascular injury is present. Skeleton: Vertebral body heights alignment are maintained. 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 No focal lytic or blastic lesions are present. Hyperostosis is noted. Other neck: Subcentimeter nodules are present within the right lobe of the thyroid. No dominant lesion is present. No follow-up is necessary. No significant cervical adenopathy is present. Salivary glands are within normal limits. No focal mucosal lesions are present. Upper chest: There is dependent atelectasis at the lung apices. Mild interstitial coarsening suggests mild edema. Review of the MIP images confirms the above findings CTA HEAD FINDINGS Anterior circulation: Atherosclerotic changes are present within the cavernous internal carotid arteries bilaterally. The lumen of the right ICA is narrowed to 1.5 mm. This compares to more distal vessel of 3 mm. There is a high-grade stenosis of the paraclinoid left ICA. There is severe stenosis of the mid  left basilar artery  measuring less than 1 mm. This compares with 2.6 mm at the bifurcation. More mild atherosclerotic changes are present in the right M1 segment. Right MCA bifurcation is intact. Left MCA bifurcation is intact. There is some attenuation of distal MCA branch vessels. Chronic left ACA occlusion is noted. Posterior circulation: The right vertebral artery is the dominant vessel. PICA origins are visualized and normal. Vertebrobasilar junction is normal. Basilar artery is within normal limits. Both posterior cerebral arteries originate from the basilar tip. There is moderate narrowing of the proximal P2 segments bilaterally. Distal PCA branch vessels are intact with distal irregularity. Venous sinuses: Dural sinuses are patent. Straight sinus deep cerebral veins are intact. Cortical veins are within normal limits. Anatomic variants: None Delayed phase: Postcontrast images demonstrate no pathologic enhancement. 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 Review of the MIP images confirms the above findings CT Brain Perfusion Findings: CBF (<30%) Volume: 6mL Perfusion (Tmax>6.0s) volume: 87mL Mismatch Volume: 81mL Infarction Location:Left MCA territory IMPRESSION: 1. 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 2. The area of reduced cerebral blood flow on the CT perfusion corresponds to a remote infarct and is likely artifactual. The area of acute ischemia on the MRI is not demonstrated on the CT perfusion exam. 3. Large area of ischemic penumbra involving the residual left MCA territory, likely secondary to the high-grade stenoses proximally. 4. 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 5. Tortuosity of the cervical internal carotid arteries bilaterally without significant stenosis. 6. 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 Electronically Signed   By: Marin Roberts M.D.    On: 08/13/2018 19:29    Labs:  CBC: Recent Labs    08/13/18 1722 08/13/18 1731  WBC 7.7  --   HGB 11.4* 12.6  HCT 36.0 37.0  PLT 386  --     COAGS: Recent Labs    08/13/18 1722  INR 0.97  APTT 28    BMP: Recent Labs    08/13/18 1722 08/13/18 1731  NA 140 140  K 3.6 3.7  CL 106 106  CO2 25  --   GLUCOSE 78 75  BUN 14 14  CALCIUM 9.5  --   CREATININE 1.24* 1.30*  GFRNONAA 50*  --   GFRAA 58*  --     LIVER FUNCTION TESTS: Recent Labs    08/13/18 1722  BILITOT 1.0  AST 16  ALT 19  ALKPHOS 63  PROT 9.0*  ALBUMIN 4.4    TUMOR MARKERS: No results for input(s): AFPTM, CEA, CA199, CHROMGRNA in the last 8760 hours.  Assessment and Plan:  Acute ischemic stroke. Plan for image-guided diagnostic cerebral angiogram tentatively for tomorrow 08/15/2018 with Dr. Corliss Skains. Patient will be NPO at midnight. Afebrile and WBCs WNL. She is currently taking Plavix 75 mg once daily- ok to proceed per Dr. Corliss Skains. INR 0.97 seconds 08/13/2018.  Risks and benefits of cerebral angiogram were discussed with the patient including, but not limited to bleeding, infection, vascular injury or contrast induced renal failure. This interventional procedure involves the use of X-rays and because of the nature of the planned procedure, it is possible that we will have prolonged use of X-ray fluoroscopy. Potential radiation risks to you include (but are not limited to) the following: - A slightly elevated risk for cancer  several years later in life. This risk is typically less than 0.5%  percent. This risk is low in comparison to the normal incidence of human cancer, which is 33% for women and 50% for men according to the [employer]. - Radiation induced injury can include skin redness, resembling a rash, tissue breakdown / ulcers and hair loss (which can be temporary or permanent).  The likelihood of either of these occurring depends on the difficulty of the procedure and  whether you are sensitive to radiation due to previous procedures, disease, or genetic conditions.  IF your procedure requires a prolonged use of radiation, you will be notified and given written instructions for further action.  It is your responsibility to monitor the irradiated area for the 2 weeks following the procedure and to notify your physician if you are concerned that you have suffered a radiation induced injury.   All of the patient's questions were answered, patient is agreeable to proceed. Consent signed and in chart.   Thank you for this interesting consult.  I greatly enjoyed meeting Julia Ferguson and look forward to participating in their care.  A copy of this report was sent to the requesting provider on this date.  Electronically Signed: Elwin Mocha, PA-C 08/14/2018, 11:53 AM   I spent a total of 20 Minutes in face to face in clinical consultation, greater than 50% of which was counseling/coordinating care for acute ischemic stroke.

## 2018-08-14 NOTE — Procedures (Signed)
ELECTROENCEPHALOGRAM REPORT   Patient: Julia Ferguson       Room #: 5W30C EEG No. ID: [id number] Age: 47 y.o.        Sex: female Referring Physician: Elgergawy Report Date:  08/14/2018        Interpreting Physician: Thana FarrEYNOLDS, Leyanna Bittman  History: Julia Sharpsammel Carothers is an 47 y.o. female with history of stroke presenting with right arm jerking  Medications:  ASA, Lipitor, Plavix, Zetia, Neurontin, Insulin, Keppra  Conditions of Recording:  This is a 21 channel routine scalp EEG performed with bipolar and monopolar montages arranged in accordance to the international 10/20 system of electrode placement. One channel was dedicated to EKG recording.  The patient is in the awake and drowsy states.  Description:  The waking background activity consists of a low voltage, fairly well organized, 10 Hz alpha activity, seen from the parieto-occipital and posterior temporal regions.  Low voltage fast activity, poorly organized, is seen anteriorly and is at times superimposed on more posterior regions.  A mixture of theta and alpha rhythms are seen from the central and temporal regions.  This activity is not as well sustained over the left hemisphere where there is noted intermittent slowing with an underlying polymorphic delta rhythm. The patient drowses with slowing to irregular, low voltage theta and beta activity.  This intermittent left hemispheric slowing is noted with drowse as well.   Stage II sleep is not obtained. No epileptiform activity is noted.   Hyperventilation and intermittent photic stimulation were not performed.   IMPRESSION: This is an abnormal electroencephalogram secondary to left hemispheric intermittent slowing which is consistent with the patient's history of left hemispheric infarcts.     Thana FarrLeslie Alando Colleran, MD Neurology [phone] 08/14/2018, 4:25 PM

## 2018-08-14 NOTE — Progress Notes (Signed)
SLP Cancellation Note  Patient Details Name: Phoebe Sharpsammel Detwiler MRN: [id number] DOB: 01/04/1971   Cancelled treatment:       Reason Eval/Treat Not Completed: Patient at procedure or test/unavailable   Maxcine Hamaiewonsky, Deshun Sedivy 08/14/2018, 1:21 PM  Maxcine HamLaura Paiewonsky, M.A. CCC-SLP [facility] Pager [phone] Office [phone]

## 2018-08-14 NOTE — Progress Notes (Signed)
  Echocardiogram 2D Echocardiogram has been performed.  Julia Ferguson, Julia Ferguson 08/14/2018, 1:34 PM

## 2018-08-14 NOTE — Progress Notes (Signed)
STROKE TEAM PROGRESS NOTE   SUBJECTIVE (INTERVAL HISTORY) Her parents are at the bedside.  Pt orientated but not able to provide her stroke history with me. Most of the history was obtained from her mom.  Mom said patient had first stroke in 2017 while she was working and did not feel good.  She was admitted for 3 to 4 days for headache and increased blood pressure but was told to have a stroke due to high blood pressure and diabetes not in good control.  No deficit residual.  She had her second stroke 2018 with right-sided weakness and speech difficulty, admitted to hospital for 5 days.  Was told due to blood pressure and diabetes and a stressful job.  Discharged on aspirin and Plavix which was continued until now.  This time she had a one-week history of worsening right-sided weakness and speech difficulty, had MRI/CT at [facility] showed new stroke and was discharged with outpatient neurology follow-up which was not able to set up due to appointment will be in January.  For the last 1 week patient had episode of right arm jerking shaking as well as tonic flexion along with staring spells, lasting 15 to 30 seconds, followed with right arm Todd's paralysis lasting for 30 minutes.  It happens 1-2 times per day.  OBJECTIVE Vitals:   08/14/18 1007 08/14/18 1230 08/14/18 1405 08/14/18 1557  BP: 122/70 133/79 (!) 151/76 (!) 146/94  Pulse: 81 82 72 77  Resp:  18 16 20   Temp: 98.4 F (36.9 C) 98.2 F (36.8 C) 97.9 F (36.6 C) 98 F (36.7 C)  TempSrc: Oral Oral Oral   SpO2: 98% 98% 100% 100%  Weight:      Height:        CBC:  Recent Labs  Lab 08/13/18 1722 08/13/18 1731  WBC 7.7  --   NEUTROABS 4.9  --   HGB 11.4* 12.6  HCT 36.0 37.0  MCV 85.7  --   PLT 386  --     Basic Metabolic Panel:  Recent Labs  Lab 08/13/18 1722 08/13/18 1731  NA 140 140  K 3.6 3.7  CL 106 106  CO2 25  --   GLUCOSE 78 75  BUN 14 14  CREATININE 1.24* 1.30*  CALCIUM 9.5  --      Lipid Panel: No results found for: CHOL, TRIG, HDL, CHOLHDL, VLDL, LDLCALC HgbA1c: No results found for: HGBA1C Urine Drug Screen:     Component Value Date/Time   LABOPIA NONE DETECTED 08/14/2018 0829   COCAINSCRNUR NONE DETECTED 08/14/2018 0829   LABBENZ NONE DETECTED 08/14/2018 0829   AMPHETMU NONE DETECTED 08/14/2018 0829   THCU NONE DETECTED 08/14/2018 0829   LABBARB NONE DETECTED 08/14/2018 0829    Alcohol Level     Component Value Date/Time   ETH <10 08/13/2018 1722    IMAGING  Ct Angio Head W Or Wo Contrast Ct Angio Neck W Or Wo Contrast Ct Cerebral Perfusion W Contrast 08/13/2018 IMPRESSION:  1. 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  2. The area of reduced cerebral blood flow on the CT perfusion corresponds to a remote infarct and is likely artifactual. The area of acute ischemia on the MRI is not demonstrated on the CT perfusion exam.  3. Large area of ischemic penumbra involving the residual left MCA territory, likely secondary to the high-grade stenoses proximally.  4. 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  5. Tortuosity of the cervical internal carotid arteries bilaterally without significant stenosis.  6. 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    Mr Brain Wo Contrast (neuro Protocol) 08/13/2018 IMPRESSION:  1. Mild motion degraded examination. Acute to subacute LEFT parietal/MCA territory infarct.  2. 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  3. Mild-to-moderate chronic small vessel ischemic changes.     Transthoracic Echocardiogram  08/14/2018 Study Conclusions - Left ventricle: The cavity size was normal. Wall thickness was   normal. Systolic function was vigorous. The estimated ejection   fraction was in the range of 65% to 70%. Wall motion was normal;   there were no regional wall motion  abnormalities. Doppler   parameters are consistent with abnormal left ventricular   relaxation (grade 1 diastolic dysfunction). Impressions: - No cardiac source of emboli was indentified.   EEG pending  DSA pending    PHYSICAL EXAM  Temp:  [97.9 F (36.6 C)-98.9 F (37.2 C)] 98 F (36.7 C) (11/22 1557) Pulse Rate:  [71-84] 77 (11/22 1557) Resp:  [12-20] 20 (11/22 1557) BP: (119-160)/(69-106) 146/94 (11/22 1557) SpO2:  [96 %-100 %] 100 % (11/22 1557) Weight:  [85.6 kg] 85.6 kg (11/22 0213)  General - Well nourished, well developed, in no apparent distress.  Ophthalmologic - fundi not visualized due to noncooperation.  Cardiovascular - Regular rate and rhythm.  Neuro - awake, alert, sitting in chair, orientated to self, and people, not orientated to time.  Not able to tell me about her past and current stroke events.  Able to follow simple commands, able to name 3 out of 3, able to repeat, mild to moderate dysarthria.  Visual field full.  PERRL, EOMI.  Mild right facial droop, tongue midline.  Sensation symmetrical bilateral face.  Left upper and lower extremity 5/5 muscle strength, right upper extremity 3+/5 proximal, 3/5 distally.  Right lower extremity 4/5 proximal and 3/5 distally.  Incision symmetrical.  Bilateral finger-to-nose no ataxia but slow in action.  Gait not tested.   ASSESSMENT/PLAN Julia Ferguson is a 47 y.o. female with history of previous stroke, Htn, and DM presenting with increased right sided weakness. She did not receive IV t-PA due to late presentation.  Stroke: Lt MCA small acute on large chronic infarct - likely due to left cavernous ICA and MCA high-grade stenosis.    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 Multiple old infarcts involving left ACA and left anterior and posterior frontal  MCA.  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   Cerebral angiogram pending tomorrow to evaluate left ICA and MCA stenosis  2D Echo  - EF 65 - 70%. No cardiac source of emboli identified.   LDL - pending  HgbA1c pending  Hypercoagulable and autoimmune work-up pending  UDS - negative  EEG - left hemispheric intermittent slowing, no seizure  VTE prophylaxis - SCDs  Diet - Heart healthy / carb modified with thin liquids.  aspirin 81 mg daily and clopidogrel 75 mg daily for more than a year prior to admission, now on aspirin 325 mg daily and clopidogrel 75 mg daily given severe intracranial stenosis.  Continue on discharge  Patient counseled to be compliant  with her antithrombotic medications  Ongoing aggressive stroke risk factor management  Therapy recommendations:  pending  Disposition:  Pending  Complex partial seizure  For the past week, patient had right arm shaking jerking with tonic flexion lasting 15 to 30 seconds, followed by Todd's paralysis for 30 minutes also associate with staring spells.  Had Keppra load and now on Keppra 500 twice daily  EEG no seizure but left hemispheric intermittent slowing  Continue Keppra for seizure control on discharge  History of stroke  2017 while she was working and did not feel good.  She was admitted for 3 to 4 days for headache and high BP and was told to have a stroke due to high BP and diabetes not in good control.  No deficit residual.    Second stroke in 2018 with right-sided weakness and speech difficulty, admitted to hospital for 5 days.  Was told due to blood pressure and diabetes and a stressful job.  Discharged on aspirin and Plavix which was continued until now.    one-week [facility] right-sided weakness and speech  difficulty, [facility] MCA stroke and was discharged with outpatient neurology follow-up which was not able to set up due to appointment will be in January.    ?? Need to rule out CNS vasculitis   less likely given large vessel involvement instead of medium size or small size vessels  No typical presentation with headache and intermittent cognitive impairment  We will do cerebral angiogram in a.m.  Not a candidate for LP at this time given long-term Plavix use.  Will recommend outpatient neurology follow-up and to do LP as outpatient with Plavix holding for 7 days prior to LP.  We will set outpatient follow[facility] in [facility] weeks.  Hypertension  Stable . Permissive hypertension (OK if < 220/120) but gradually normalize in 5-7 days . Long-term BP goal 130-150 given severe intracranial stenosis  Hyperlipidemia  Lipid lowering medication PTA:  Lipitor 80 mg daily and Zetia 10 mg daily  LDL pending, goal < 70  Current lipid lowering medication: Lipitor 80 mg daily and Zetia 10 mg daily  Continue statin at discharge  Diabetes  HgbA1c pending, goal < 7.0  Controlled  Glucose under control in the hospital  SSI  CBG monitoring  Other Stroke Risk Factors  Obesity, Body mass index is 34.52 kg/m., recommend weight loss, diet and exercise as appropriate   Other Active Problems  Elevated creatinine 1.30  Cognitive impairment - residual deficit from previous stroke  Hospital day # 0  I spent  35 minutes in total face-to-face time with the patient, more than 50% of which was spent in counseling and coordination of care, reviewing test results, images and medication, and discussing the diagnosis of recurrent stroke, complex partial seizure, history of stroke, concerning for CNS vasculitis, long-standing hypertension and diabetes, treatment plan and potential prognosis. This patient's care requiresreview of multiple  databases, neurological assessment, discussion with family, other specialists and medical decision making of high complexity.  Marvel PlanJindong Fenix Rorke, MD PhD Stroke Neurology 08/14/2018 5:40 PM   To contact Stroke Continuity provider, please refer to [email or web address]. After hours, contact General Neurology

## 2018-08-15 ENCOUNTER — Inpatient Hospital Stay (HOSPITAL_COMMUNITY): Payer: Medicaid - Out of State

## 2018-08-15 DIAGNOSIS — I63232 Cerebral infarction due to unspecified occlusion or stenosis of left carotid arteries: Secondary | ICD-10-CM

## 2018-08-15 DIAGNOSIS — I63512 Cerebral infarction due to unspecified occlusion or stenosis of left middle cerebral artery: Principal | ICD-10-CM

## 2018-08-15 DIAGNOSIS — I639 Cerebral infarction, unspecified: Secondary | ICD-10-CM

## 2018-08-15 DIAGNOSIS — E785 Hyperlipidemia, unspecified: Secondary | ICD-10-CM

## 2018-08-15 HISTORY — PX: IR ANGIO INTRA EXTRACRAN SEL COM CAROTID INNOMINATE BILAT MOD SED: IMG5360

## 2018-08-15 HISTORY — PX: IR ANGIO VERTEBRAL SEL VERTEBRAL BILAT MOD SED: IMG5369

## 2018-08-15 LAB — CBC
HCT: 33.4 % — ABNORMAL LOW (ref 36.0–46.0)
Hemoglobin: 10.7 g/dL — ABNORMAL LOW (ref 12.0–15.0)
MCH: 27.1 pg (ref 26.0–34.0)
MCHC: 32 g/dL (ref 30.0–36.0)
MCV: 84.6 fL (ref 80.0–100.0)
Platelets: 352 10*3/uL (ref 150–400)
RBC: 3.95 MIL/uL (ref 3.87–5.11)
RDW: 13.6 % (ref 11.5–15.5)
WBC: 7.7 10*3/uL (ref 4.0–10.5)
nRBC: 0 % (ref 0.0–0.2)

## 2018-08-15 LAB — LUPUS ANTICOAGULANT PANEL
DRVVT: 37.9 s (ref 0.0–47.0)
PTT Lupus Anticoagulant: 31.5 s (ref 0.0–51.9)

## 2018-08-15 LAB — EXTRACTABLE NUCLEAR ANTIGEN ANTIBODY
DS DNA AB: 1 [IU]/mL (ref 0–9)
ENA SM Ab Ser-aCnc: <0.2 AI (ref 0.0–0.9)
Ribonucleic Protein: <0.2 AI (ref 0.0–0.9)
Scleroderma (Scl-70) (ENA) Antibody, IgG: <0.2 AI (ref 0.0–0.9)

## 2018-08-15 LAB — LIPID PANEL
Cholesterol: 138 mg/dL (ref 0–200)
HDL: 42 mg/dL (ref >40)
LDL CALC: 83 mg/dL (ref 0–99)
TRIGLYCERIDES: 63 mg/dL (ref <150)
Total CHOL/HDL Ratio: 3.3 RATIO
VLDL: 13 mg/dL (ref 0–40)

## 2018-08-15 LAB — BASIC METABOLIC PANEL
Anion gap: 10 (ref 5–15)
BUN: 13 mg/dL (ref 6–20)
CALCIUM: 9.3 mg/dL (ref 8.9–10.3)
CHLORIDE: 103 mmol/L (ref 98–111)
CO2: 24 mmol/L (ref 22–32)
Creatinine, Ser: 1.41 mg/dL — ABNORMAL HIGH (ref 0.44–1.00)
GFR calc Af Amer: 50 mL/min — ABNORMAL LOW (ref >60)
GFR, EST NON AFRICAN AMERICAN: 43 mL/min — AB (ref >60)
Glucose, Bld: 131 mg/dL — ABNORMAL HIGH (ref 70–99)
POTASSIUM: 3.4 mmol/L — AB (ref 3.5–5.1)
Sodium: 137 mmol/L (ref 135–145)

## 2018-08-15 LAB — HEPATITIS PANEL, ACUTE
HCV Ab: <0.1 s/co ratio (ref 0.0–0.9)
HEP A IGM: NEGATIVE
HEP B C IGM: NEGATIVE
Hepatitis B Surface Ag: NEGATIVE

## 2018-08-15 LAB — BETA-2-GLYCOPROTEIN I ABS, IGG/M/A
Beta-2 Glyco I IgG: <9 GPI IgG units (ref 0–20)
Beta-2-Glycoprotein I IgM: <9 GPI IgM units (ref 0–32)

## 2018-08-15 LAB — C4 COMPLEMENT: Complement C4, Body Fluid: 41 mg/dL (ref 14–44)

## 2018-08-15 LAB — GLUCOSE, CAPILLARY
GLUCOSE-CAPILLARY: 125 mg/dL — AB (ref 70–99)
GLUCOSE-CAPILLARY: 114 mg/dL — AB (ref 70–99)
GLUCOSE-CAPILLARY: 165 mg/dL — AB (ref 70–99)
Glucose-Capillary: 116 mg/dL — ABNORMAL HIGH (ref 70–99)
Glucose-Capillary: 175 mg/dL — ABNORMAL HIGH (ref 70–99)

## 2018-08-15 LAB — ANTI-JO 1 ANTIBODY, IGG

## 2018-08-15 LAB — PROTEIN C, TOTAL: PROTEIN C, TOTAL: 91 % (ref 60–150)

## 2018-08-15 LAB — HEMOGLOBIN A1C
HEMOGLOBIN A1C: 6.9 % — AB (ref 4.8–5.6)
Mean Plasma Glucose: 151.33 mg/dL

## 2018-08-15 LAB — PROTEIN S ACTIVITY: PROTEIN S ACTIVITY: 78 % (ref 63–140)

## 2018-08-15 LAB — C3 COMPLEMENT: C3 COMPLEMENT: 163 mg/dL (ref 82–167)

## 2018-08-15 LAB — PROTEIN C ACTIVITY: PROTEIN C ACTIVITY: 141 % (ref 73–180)

## 2018-08-15 LAB — PROTEIN S, TOTAL: Protein S Ag, Total: 96 % (ref 60–150)

## 2018-08-15 MED ORDER — NIACIN 100 MG PO TABS
100.0000 mg | ORAL_TABLET | Freq: Three times a day (TID) | ORAL | Status: DC
  Administered 2018-08-15 – 2018-08-17 (×5): 100 mg via ORAL
  Filled 2018-08-15 (×7): qty 1

## 2018-08-15 MED ORDER — FENTANYL CITRATE (PF) 100 MCG/2ML IJ SOLN
INTRAMUSCULAR | Status: AC
  Filled 2018-08-15: qty 2

## 2018-08-15 MED ORDER — POTASSIUM CHLORIDE 10 MEQ/100ML IV SOLN
10.0000 meq | INTRAVENOUS | Status: AC
  Administered 2018-08-15 (×3): 10 meq via INTRAVENOUS
  Filled 2018-08-15 (×2): qty 100

## 2018-08-15 MED ORDER — FENTANYL CITRATE (PF) 100 MCG/2ML IJ SOLN
INTRAMUSCULAR | Status: DC | PRN
  Administered 2018-08-15: 25 ug via INTRAVENOUS

## 2018-08-15 MED ORDER — IOHEXOL 300 MG/ML  SOLN: 100.0000 mL | Freq: Once | INTRAMUSCULAR | Status: DC | PRN

## 2018-08-15 MED ORDER — POTASSIUM CHLORIDE 10 MEQ/100ML IV SOLN
10.0000 meq | INTRAVENOUS | Status: AC
  Filled 2018-08-15: qty 100

## 2018-08-15 MED ORDER — MIDAZOLAM HCL 2 MG/2ML IJ SOLN
INTRAMUSCULAR | Status: DC | PRN
  Administered 2018-08-15: 1 mg via INTRAVENOUS

## 2018-08-15 MED ORDER — HYDRALAZINE HCL 20 MG/ML IJ SOLN
INTRAMUSCULAR | Status: AC
  Filled 2018-08-15: qty 1

## 2018-08-15 MED ORDER — HYDRALAZINE HCL 20 MG/ML IJ SOLN
INTRAMUSCULAR | Status: DC | PRN
  Administered 2018-08-15: 5 mg via INTRAVENOUS

## 2018-08-15 MED ORDER — LIDOCAINE HCL 1 % IJ SOLN
INTRAMUSCULAR | Status: AC
  Filled 2018-08-15: qty 20

## 2018-08-15 MED ORDER — SODIUM CHLORIDE 0.9 % IV SOLN: INTRAVENOUS | Status: AC

## 2018-08-15 MED ORDER — HEPARIN SODIUM (PORCINE) 1000 UNIT/ML IJ SOLN
INTRAMUSCULAR | Status: AC
  Filled 2018-08-15: qty 1

## 2018-08-15 MED ORDER — SODIUM CHLORIDE 0.9 % IV SOLN
INTRAVENOUS | Status: DC
  Administered 2018-08-15: 10:00:00 via INTRAVENOUS

## 2018-08-15 MED ORDER — IOPAMIDOL (ISOVUE-300) INJECTION 61%
INTRAVENOUS | Status: AC
  Filled 2018-08-15: qty 50

## 2018-08-15 MED ORDER — MIDAZOLAM HCL 2 MG/2ML IJ SOLN
INTRAMUSCULAR | Status: AC
  Filled 2018-08-15: qty 2

## 2018-08-15 MED ORDER — LIDOCAINE HCL (PF) 1 % IJ SOLN
INTRAMUSCULAR | Status: DC | PRN
  Administered 2018-08-15: 10 mL

## 2018-08-15 NOTE — Progress Notes (Signed)
SLP Cancellation Note  Patient Details Name: Julia Ferguson MRN: [id number] DOB: 04/10/1971   Cancelled treatment:       Reason Eval/Treat Not Completed: Other (comment). Pt just returned from procedure.  Spoke with parents, explained plan for SLP to return and evaluate when appropriate.  Will follow.    Blenda MountsCouture, [name] 08/15/2018, 10:29 AM

## 2018-08-15 NOTE — Progress Notes (Signed)
PROGRESS NOTE                                                                                                                                                                                                             Patient Demographics:    Julia Ferguson, is a 47 y.o. female, DOB - 11-17-1970, ZOX:[id number]  Admit date - 08/13/2018   Admitting Physician Ejiroghene Wendall Stade, MD  Outpatient Primary MD for the patient is Plunk, Charlsie Quest, FNP  LOS - 1   Chief Complaint  Patient presents with  . Seizures       Brief Narrative    47 y.o. female with medical history significant for CVA, HTN, DM, who was brought to the ED by family with complaints of spasm/jerking of right upper extremity, patient with recent hospitalization at [facility] last week with worsening right-sided weakness and speech difficulty, doing a new stroke, patient was transferred for further work-up.   Subjective:   Patient in bed, appears comfortable, denies any headache, no fever, no chest pain or pressure, no shortness of breath , no abdominal pain. No focal weakness.    Assessment  & Plan :     Subacute CVA -with recent hospitalization to [facility], MRI head showing subacute left parietal CVA in the MCA territory.  Echocardiogram stable, LDL was 83 and A1c was 6.9.  Currently on Plavix and statin combination, discussed with interventional neurology and neurology on 08/15/2018, proceed with stenting procedure on coming Tuesday.  Continue supportive care.  Other work-up or vasculitis work-up per neuro team.   Lab Results  Component Value Date   CHOL 138 08/15/2018   HDL 42 08/15/2018   LDLCALC 83 08/15/2018   TRIG 63 08/15/2018   CHOLHDL 3.3 08/15/2018   Lab Results  Component Value Date   HGBA1C 6.9 (H) 08/15/2018     Hypertension  -Hold home Coreg, losartan, minoxidil in the setting of acute CVA and allow for permissive hypertension  Hyperlipidemia  -  LDL slightly above goal, already on max dose statin will add niacin.   Diabetes mellitus, type II - A1c was 6.9 on 08/08/2018, She is on 45 units GU daily, she was hypoglycemic yesterday on presentation, and her CBGs are controlled, she is going to be n.p.o., so we will continue with sliding scale currently.   CBG (last 3)  Recent Labs    08/14/18 2117 08/15/18 0931 08/15/18 1214  GLUCAP 119* 175* 125*      Code Status : full  Family Communication  : Both parents at bedside on 08/15/2018  Disposition Plan  : Pending PT consult  Barriers For Discharge :   Consults  : Neurology, interventional neurology  Procedures  :   MRI - 1. Mild motion degraded examination. Acute to subacute LEFT parietal/MCA territory infarct. 2. 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 3. Mild-to-moderate chronic small vessel ischemic changes  Angiogram  S/P  4 vessel cerebral arteriogram RT CFA approach. Findings. 1.Severe irregular seg stenosis of RT MCA M1 seg. 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 3.Severe 90 5 + stenosis of Lt VA origin 4.Severe LT PCA P1 stenosis. 5.Mod RT PCA P1 stenosis. 6.Approx 40 % LT ICA cavenous seg stenosis   DVT Prophylaxis  : SCD  Lab Results  Component Value Date   PLT 352 08/15/2018    Antibiotics  :    Anti-infectives (From admission, onward)   None        Objective:   Vitals:   08/15/18 0831 08/15/18 0840 08/15/18 0932 08/15/18 1215  BP: (!) 164/90 (!) 161/100 (!) 137/114 (!) 146/81  Pulse: 91 88 89 89  Resp: 17 16 18 18   Temp:   98.7 F (37.1 C) 98.9 F (37.2 C)  TempSrc:   Oral Oral  SpO2: 100% 100% 100% 100%  Weight:      Height:        Wt Readings from Last 3 Encounters:  08/14/18 85.6 kg     Intake/Output Summary (Last 24 hours) at 08/15/2018 1244 Last data filed at 08/15/2018 1019 Gross per 24 hour  Intake 0 ml  Output 700 ml  Net -700 ml     Physical Exam  Awake Alert, Oriented X 3, No  new F.N deficits, Normal affect [employer].AT,PERRAL Supple Neck,No JVD, No cervical lymphadenopathy appriciated.  Symmetrical Chest wall movement, Good air movement bilaterally, CTAB RRR,No Gallops, Rubs or new Murmurs, No Parasternal Heave +ve B.Sounds, Abd Soft, No tenderness, No organomegaly appriciated, No rebound - guarding or rigidity. No Cyanosis, Clubbing or edema, No new Rash or bruise       Data Review:    CBC Recent Labs  Lab 08/13/18 1722 08/13/18 1731 08/15/18 0453  WBC 7.7  --  7.7  HGB 11.4* 12.6 10.7*  HCT 36.0 37.0 33.4*  PLT 386  --  352  MCV 85.7  --  84.6  MCH 27.1  --  27.1  MCHC 31.7  --  32.0  RDW 14.0  --  13.6  LYMPHSABS 1.9  --   --   MONOABS 0.6  --   --   EOSABS 0.2  --   --   BASOSABS 0.1  --   --     Chemistries  Recent Labs  Lab 08/13/18 1722 08/13/18 1731 08/15/18 0453  NA 140 140 137  K 3.6 3.7 3.4*  CL 106 106 103  CO2 25  --  24  GLUCOSE 78 75 131*  BUN 14 14 13   CREATININE 1.24* 1.30* 1.41*  CALCIUM 9.5  --  9.3  AST 16  --   --   ALT 19  --   --   ALKPHOS 63  --   --   BILITOT 1.0  --   --    ------------------------------------------------------------------------------------------------------------------ Recent Labs    08/15/18 0453  CHOL 138  HDL 42  LDLCALC 83  TRIG 63  CHOLHDL 3.3    Lab Results  Component Value Date   HGBA1C 6.9 (H) 08/15/2018   ------------------------------------------------------------------------------------------------------------------ No results for input(s): TSH, T4TOTAL, T3FREE, THYROIDAB in the last 72 hours.  Invalid input(s): FREET3 ------------------------------------------------------------------------------------------------------------------ No results for input(s): VITAMINB12, FOLATE, FERRITIN, TIBC, IRON, RETICCTPCT in the last 72 hours.  Coagulation profile Recent Labs  Lab 08/13/18 1722  INR 0.97    No results for input(s): DDIMER in the last 72 hours.  Cardiac  Enzymes No results for input(s): CKMB, TROPONINI, MYOGLOBIN in the last 168 hours.  Invalid input(s): CK ------------------------------------------------------------------------------------------------------------------ No results found for: BNP  Inpatient Medications  Scheduled Meds: . aspirin EC  325 mg Oral Daily  . atorvastatin  80 mg Oral q1800  . chlorhexidine  15 mL Mouth Rinse BID  . clopidogrel  75 mg Oral Daily  . ezetimibe  10 mg Oral Daily  . fentaNYL      . gabapentin  600 mg Oral QHS  . heparin      . hydrALAZINE      . insulin aspart  0-9 Units Subcutaneous TID WC  . iopamidol      . levETIRAcetam  500 mg Oral BID  . lidocaine      . mouth rinse  15 mL Mouth Rinse q12n4p  . midazolam       Continuous Infusions: . sodium chloride 50 mL/hr at 08/14/18 1105  . sodium chloride 75 mL/hr at 08/15/18 1016  . potassium chloride 10 mEq (08/15/18 1019)   PRN Meds:.acetaminophen **OR** acetaminophen (TYLENOL) oral liquid 160 mg/5 mL **OR** acetaminophen, fentaNYL, hydrALAZINE, iohexol, labetalol, lidocaine (PF), midazolam, senna-docusate  Micro Results No results found for this or any previous visit (from the past 240 hour(s)).  Radiology Reports Ct Angio Head W Or Wo Contrast  Result Date: 08/13/2018 CLINICAL DATA:  Arterial stricture/occlusion of the head and neck. Weakness beginning 2 weeks ago. Recent admission for stroke. Episodes of shaking and absence staring. EXAM: CT ANGIOGRAPHY HEAD AND NECK CT PERFUSION BRAIN TECHNIQUE: Multidetector CT imaging of the head and neck was performed using the standard protocol during bolus administration of intravenous contrast. Multiplanar CT image reconstructions and MIPs were obtained to evaluate the vascular anatomy. Carotid stenosis measurements (when applicable) are obtained utilizing NASCET criteria, using the distal internal carotid diameter as the denominator. Multiphase CT imaging of the brain was performed following IV  bolus contrast injection. Subsequent parametric perfusion maps were calculated using RAPID software. CONTRAST:  ISOVUE-370 IOPAMIDOL (ISOVUE-370) INJECTION 76% COMPARISON:  MRI brain 08/13/2018 at 4:37 p.m. FINDINGS: CT HEAD FINDINGS Brain: Remote left ACA territory infarct is noted. Additional areas of remote left MCA territory infarcts are present. The areas of more acute infarct are less well delineated on noncontrast CT of the head. The right hemisphere is unremarkable. The brainstem and cerebellum are normal. Vascular: No hyperdense vessel or unexpected calcification. Skull: Calvarium is intact. No focal lytic or blastic lesions are present. Sinuses/Orbits: The paranasal sinuses mastoid air cells are clear. Globes and orbits are within normal limits. ASPECTS (Alberta Stroke Program Early CT Score) - Ganglionic level infarction (caudate, lentiform nuclei, internal capsule, insula, M1-M3 cortex): 7/7 - Supraganglionic infarction (M4-M6 cortex): 3/3 Total score (0-10 with 10 being normal): 10/10 Review of the MIP images confirms the above findings CTA NECK FINDINGS Aortic arch: A three-vessel arch configuration is present. There is no significant calcification at the aortic arch or stenosis. Right carotid  system: Right common carotid artery is within normal limits. Right ICA bifurcation scratched at the right carotid bifurcation is normal. There is tortuosity of the distal right ICA without significant stenosis. Left carotid system: The left common carotid artery is within normal limits. Bifurcation is unremarkable. There is moderate tortuosity of the distal cervical ICA without significant stenosis. Vertebral arteries: The vertebral arteries originate from the subclavian arteries bilaterally. There is a high-grade stenosis of the left vertebral artery at its origin. The right vertebral artery is the dominant vessel. No other focal stenosis or vascular injury is present. Skeleton: Vertebral body heights  alignment are maintained. 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 No focal lytic or blastic lesions are present. Hyperostosis is noted. Other neck: Subcentimeter nodules are present within the right lobe of the thyroid. No dominant lesion is present. No follow-up is necessary. No significant cervical adenopathy is present. Salivary glands are within normal limits. No focal mucosal lesions are present. Upper chest: There is dependent atelectasis at the lung apices. Mild interstitial coarsening suggests mild edema. Review of the MIP images confirms the above findings CTA HEAD FINDINGS Anterior circulation: Atherosclerotic changes are present within the cavernous internal carotid arteries bilaterally. The lumen of the right ICA is narrowed to 1.5 mm. This compares to more distal vessel of 3 mm. There is a high-grade stenosis of the paraclinoid left ICA. 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 This compares with 2.6 mm at the bifurcation. More mild atherosclerotic changes are present in the right M1 segment. Right MCA bifurcation is intact. Left MCA bifurcation is intact. There is some attenuation of distal MCA branch vessels. Chronic left ACA occlusion is noted. Posterior circulation: The right vertebral artery is the dominant vessel. PICA origins are visualized and normal. Vertebrobasilar junction is normal. Basilar artery is within normal limits. Both posterior cerebral arteries originate from the basilar tip. There is moderate narrowing of the proximal P2 segments bilaterally. Distal PCA branch vessels are intact with distal irregularity. Venous sinuses: Dural sinuses are patent. Straight sinus deep cerebral veins are intact. Cortical veins are within normal limits. Anatomic variants: None Delayed phase: Postcontrast images demonstrate no pathologic enhancement. 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  Review of the MIP images confirms the above findings CT Brain Perfusion Findings: CBF (<30%) Volume: 6mL Perfusion (Tmax>6.0s) volume: 87mL Mismatch Volume: 81mL Infarction Location:Left MCA territory IMPRESSION: 1. 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 2. The area of reduced cerebral blood flow on the CT perfusion corresponds to a remote infarct and is likely artifactual. The area of acute ischemia on the MRI is not demonstrated on the CT perfusion exam. 3. Large area of ischemic penumbra involving the residual left MCA territory, likely secondary to the high-grade stenoses proximally. 4. 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 5. Tortuosity of the cervical internal carotid arteries bilaterally without significant stenosis. 6. 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 Electronically Signed   By: Marin Robertshristopher  Mattern M.D.   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 Weakness beginning 2 weeks ago. Recent admission for stroke. Episodes of shaking and absence staring. EXAM: CT ANGIOGRAPHY HEAD AND NECK CT PERFUSION BRAIN TECHNIQUE: Multidetector CT imaging of the head and  neck was performed using the standard protocol during bolus administration of intravenous contrast. Multiplanar CT image reconstructions and MIPs were obtained to evaluate the vascular anatomy. Carotid stenosis measurements (when applicable) are obtained utilizing NASCET criteria, using the distal internal carotid diameter as the denominator. Multiphase CT imaging of the brain was performed following IV bolus contrast injection. Subsequent parametric perfusion maps were calculated using RAPID software. CONTRAST:  ISOVUE-370 IOPAMIDOL (ISOVUE-370) INJECTION 76% COMPARISON:  MRI brain 08/13/2018 at 4:37 p.m. FINDINGS: CT  HEAD FINDINGS Brain: Remote left ACA territory infarct is noted. Additional areas of remote left MCA territory infarcts are present. The areas of more acute infarct are less well delineated on noncontrast CT of the head. The right hemisphere is unremarkable. The brainstem and cerebellum are normal. Vascular: No hyperdense vessel or unexpected calcification. Skull: Calvarium is intact. No focal lytic or blastic lesions are present. Sinuses/Orbits: The paranasal sinuses mastoid air cells are clear. Globes and orbits are within normal limits. ASPECTS (Alberta Stroke Program Early CT Score) - Ganglionic level infarction (caudate, lentiform nuclei, internal capsule, insula, M1-M3 cortex): 7/7 - Supraganglionic infarction (M4-M6 cortex): 3/3 Total score (0-10 with 10 being normal): 10/10 Review of the MIP images confirms the above findings CTA NECK FINDINGS Aortic arch: A three-vessel arch configuration is present. There is no significant calcification at the aortic arch or stenosis. Right carotid system: Right common carotid artery is within normal limits. Right ICA bifurcation scratched at the right carotid bifurcation is normal. There is tortuosity of the distal right ICA without significant stenosis. Left carotid system: The left common carotid artery is within normal limits. Bifurcation is unremarkable. There is moderate tortuosity of the distal cervical ICA without significant stenosis. Vertebral arteries: The vertebral arteries originate from the subclavian arteries bilaterally. There is a high-grade stenosis of the left vertebral artery at its origin. The right vertebral artery is the dominant vessel. No other focal stenosis or vascular injury is present. Skeleton: Vertebral body heights alignment are maintained. 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 No focal lytic or blastic lesions are present. Hyperostosis is noted. Other neck: Subcentimeter nodules are present within the  right lobe of the thyroid. No dominant lesion is present. No follow-up is necessary. No significant cervical adenopathy is present. Salivary glands are within normal limits. No focal mucosal lesions are present. Upper chest: There is dependent atelectasis at the lung apices. Mild interstitial coarsening suggests mild edema. Review of the MIP images confirms the above findings CTA HEAD FINDINGS Anterior circulation: Atherosclerotic changes are present within the cavernous internal carotid arteries bilaterally. The lumen of the right ICA is narrowed to 1.5 mm. This compares to more distal vessel of 3 mm. There is a high-grade stenosis of the paraclinoid left ICA. 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 This compares with 2.6 mm at the bifurcation. More mild atherosclerotic changes are present in the right M1 segment. Right MCA bifurcation is intact. Left MCA bifurcation is intact. There is some attenuation of distal MCA branch vessels. Chronic left ACA occlusion is noted. Posterior circulation: The right vertebral artery is the dominant vessel. PICA origins are visualized and normal. Vertebrobasilar junction is normal. Basilar artery is within normal limits. Both posterior cerebral arteries originate from the basilar tip. There is moderate narrowing of the proximal P2 segments bilaterally. Distal PCA branch vessels are intact with distal irregularity. Venous sinuses: Dural sinuses are patent. Straight sinus deep cerebral veins are intact.  Cortical veins are within normal limits. Anatomic variants: None Delayed phase: Postcontrast images demonstrate no pathologic enhancement. 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 Review of the MIP images confirms the above findings CT Brain Perfusion Findings: CBF (<30%) Volume: 6mL Perfusion (Tmax>6.0s) volume: 87mL Mismatch Volume: 81mL Infarction Location:Left MCA territory IMPRESSION: 1. High-grade  stenosis of the supraclinoid left internal carotid artery and the left M1 segment. 2. The area of reduced cerebral blood flow on the CT perfusion corresponds to a remote infarct and is likely artifactual. The area of acute ischemia on the MRI is not demonstrated on the CT perfusion exam. 3. Large area of ischemic penumbra involving the residual left MCA territory, likely secondary to the high-grade stenoses proximally. 4. 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 5. Tortuosity of the cervical internal carotid arteries bilaterally without significant stenosis. 6. 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 Electronically Signed   By: Marin Roberts M.D.   On: 08/13/2018 19:29   Mr Brain Wo Contrast (neuro Protocol)  Result Date: 08/13/2018 CLINICAL DATA:  Seizure like episodes for 2 weeks. History of stroke, hypertension and diabetes. EXAM: MRI HEAD WITHOUT CONTRAST TECHNIQUE: Multiplanar, multiecho pulse sequences of the brain and surrounding structures were obtained without intravenous contrast. COMPARISON:  None. FINDINGS: Mild motion degraded examination. INTRACRANIAL CONTENTS: Patchy LEFT parietal reduced diffusion with nearly normalized ADC values and bright FLAIR signal. Spurious reduced diffusion mesial LEFT frontoparietal lobes associated with confluent encephalomalacia and susceptibility artifact. Ex vacuo dilatation LEFT lateral ventricle. Patchy LEFT parietooccipital lobe encephalomalacia. Smaller T2 bright LEFT cerebral peduncle and patchy LEFT pontine T2 hyperintense signal consistent with wallerian degeneration. Patchy supratentorial white matter FLAIR T2 hyperintensities. No susceptibility artifact to suggest hemorrhage. The ventricles and sulci are normal for patient's age. No suspicious parenchymal signal, masses, mass effect. No abnormal extra-axial fluid collections. No extra-axial masses. VASCULAR: Normal major intracranial  vascular flow voids present at skull base. SKULL AND UPPER CERVICAL SPINE: No abnormal sellar expansion. No suspicious calvarial bone marrow signal. Craniocervical junction maintained. SINUSES/ORBITS: The mastoid air-cells and included paranasal sinuses are well-aerated.The included ocular globes and orbital contents are non-suspicious. OTHER: None. IMPRESSION: 1. Mild motion degraded examination. Acute to subacute LEFT parietal/MCA territory infarct. 2. 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 3. Mild-to-moderate chronic small vessel ischemic changes. Electronically Signed   By: Awilda Metro M.D.   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 Weakness beginning 2 weeks ago. Recent admission for stroke. Episodes of shaking and absence staring. EXAM: CT ANGIOGRAPHY HEAD AND NECK CT PERFUSION BRAIN TECHNIQUE: Multidetector CT imaging of the head and neck was performed using the standard protocol during bolus administration of intravenous contrast. Multiplanar CT image reconstructions and MIPs were obtained to evaluate the vascular anatomy. Carotid stenosis measurements (when applicable) are obtained utilizing NASCET criteria, using the distal internal carotid diameter as the denominator. Multiphase CT imaging of the brain was performed following IV bolus contrast injection. Subsequent parametric perfusion maps were calculated using RAPID software. CONTRAST:  ISOVUE-370 IOPAMIDOL (ISOVUE-370) INJECTION 76% COMPARISON:  MRI brain 08/13/2018 at 4:37 p.m. FINDINGS: CT HEAD FINDINGS Brain: Remote left ACA territory infarct is noted. Additional areas of remote left MCA territory infarcts are present. The areas of more acute infarct are less well delineated on noncontrast CT of the  head. The right hemisphere is unremarkable. The brainstem and cerebellum are normal. Vascular: No  hyperdense vessel or unexpected calcification. Skull: Calvarium is intact. No focal lytic or blastic lesions are present. Sinuses/Orbits: The paranasal sinuses mastoid air cells are clear. Globes and orbits are within normal limits. ASPECTS (Alberta Stroke Program Early CT Score) - Ganglionic level infarction (caudate, lentiform nuclei, internal capsule, insula, M1-M3 cortex): 7/7 - Supraganglionic infarction (M4-M6 cortex): 3/3 Total score (0-10 with 10 being normal): 10/10 Review of the MIP images confirms the above findings CTA NECK FINDINGS Aortic arch: A three-vessel arch configuration is present. There is no significant calcification at the aortic arch or stenosis. Right carotid system: Right common carotid artery is within normal limits. Right ICA bifurcation scratched at the right carotid bifurcation is normal. There is tortuosity of the distal right ICA without significant stenosis. Left carotid system: The left common carotid artery is within normal limits. Bifurcation is unremarkable. There is moderate tortuosity of the distal cervical ICA without significant stenosis. Vertebral arteries: The vertebral arteries originate from the subclavian arteries bilaterally. There is a high-grade stenosis of the left vertebral artery at its origin. The right vertebral artery is the dominant vessel. No other focal stenosis or vascular injury is present. Skeleton: Vertebral body heights alignment are maintained. 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 No focal lytic or blastic lesions are present. Hyperostosis is noted. Other neck: Subcentimeter nodules are present within the right lobe of the thyroid. No dominant lesion is present. No follow-up is necessary. No significant cervical adenopathy is present. Salivary glands are within normal limits. No focal mucosal lesions are present. Upper chest: There is dependent atelectasis at the lung apices. Mild interstitial coarsening  suggests mild edema. Review of the MIP images confirms the above findings CTA HEAD FINDINGS Anterior circulation: Atherosclerotic changes are present within the cavernous internal carotid arteries bilaterally. The lumen of the right ICA is narrowed to 1.5 mm. This compares to more distal vessel of 3 mm. There is a high-grade stenosis of the paraclinoid left ICA. 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 This compares with 2.6 mm at the bifurcation. More mild atherosclerotic changes are present in the right M1 segment. Right MCA bifurcation is intact. Left MCA bifurcation is intact. There is some attenuation of distal MCA branch vessels. Chronic left ACA occlusion is noted. Posterior circulation: The right vertebral artery is the dominant vessel. PICA origins are visualized and normal. Vertebrobasilar junction is normal. Basilar artery is within normal limits. Both posterior cerebral arteries originate from the basilar tip. There is moderate narrowing of the proximal P2 segments bilaterally. Distal PCA branch vessels are intact with distal irregularity. Venous sinuses: Dural sinuses are patent. Straight sinus deep cerebral veins are intact. Cortical veins are within normal limits. Anatomic variants: None Delayed phase: Postcontrast images demonstrate no pathologic enhancement. 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 Review of the MIP images confirms the above findings CT Brain Perfusion Findings: CBF (<30%) Volume: 6mL Perfusion (Tmax>6.0s) volume: 87mL Mismatch Volume: 81mL Infarction Location:Left MCA territory IMPRESSION: 1. 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 2. The area of reduced cerebral blood flow on the CT perfusion corresponds to a remote infarct and is likely artifactual. The area of acute ischemia on the MRI is not demonstrated on the CT perfusion exam. 3. Large area of  ischemic penumbra involving the  residual left MCA territory, likely secondary to the high-grade stenoses proximally. 4. 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 5. Tortuosity of the cervical internal carotid arteries bilaterally without significant stenosis. 6. 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 Electronically Signed   By: Marin Roberts M.D.   On: 08/13/2018 19:29     Signature  Susa Raring M.D on 08/15/2018 at 12:44 PM   -  To page go to [email or web address] - password [facility]

## 2018-08-15 NOTE — Progress Notes (Signed)
OT Cancellation Note  Patient Details Name: Phoebe Sharpsammel Holdman MRN: [id number] DOB: 07/07/1971   Cancelled Treatment:    Reason Eval/Treat Not Completed: Fatigue/lethargy limiting ability to participate(pt just returned from cerebral angiogram, will follow)  Evern BioMayberry, Aiko Belko Lynn 08/15/2018, 11:06 AM  Martie RoundJulie Martrell Eguia, OTR/L Acute Rehabilitation Services Pager: [phone] Office: [phone]

## 2018-08-15 NOTE — Sedation Documentation (Signed)
Patient is resting comfortably. 

## 2018-08-15 NOTE — Progress Notes (Signed)
PT Cancellation Note  Patient Details Name: Julia Ferguson MRN: [id number] DOB: 01/13/1971   Cancelled Treatment:    Reason Eval/Treat Not Completed: Other (comment)(Pt just bathed by nursing.  Refused to get back up.)   [name] 08/15/2018, 2:01 PM Lewanda Perea,PT Acute Rehabilitation Services Pager:  [phone]  Office:  [phone]

## 2018-08-15 NOTE — Procedures (Addendum)
S/P  4 vessel cerebral arteriogram RT CFA approach. Findings. 1.Severe irregular seg stenosis of LT MCA M1 seg. 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 3.Severe 90 5 + stenosis of Lt VA origin 4.Severe LT PCA P1 stenosis. 5.Mod RT PCA P1 stenosis. 6.Approx 40 % LT ICA cavenous seg stenosis

## 2018-08-15 NOTE — Sedation Documentation (Signed)
Patient is resting comfortably. No complaints

## 2018-08-15 NOTE — Sedation Documentation (Signed)
Patient is resting comfortably. No complaints from pt at this time. Pt able to follow commands

## 2018-08-15 NOTE — Progress Notes (Signed)
STROKE TEAM PROGRESS NOTE   SUBJECTIVE (INTERVAL HISTORY) Her multiple family members are at bedside.  She lying in bed comfortably.  No complaints.  No acute event overnight.  Had cerebral angiogram with Dr. Corliss Skains this am showed right M1 severe stenosis, left ACA occluded, left P1 severe stenosis and right P1 moderate stenosis.  However, left ICA cavernous segment only 40% stenosis.  Planning for right MCA stenting on Tuesday.  OBJECTIVE Vitals:   08/14/18 1405 08/14/18 1557 08/14/18 2129 08/15/18 0508  BP: (!) 151/76 (!) 146/94 140/82 123/77  Pulse: 72 77 77 88  Resp: 16 20 18 18   Temp: 97.9 F (36.6 C) 98 F (36.7 C) 98.4 F (36.9 C) (!) 97.4 F (36.3 C)  TempSrc: Oral     SpO2: 100% 100% 98% 99%  Weight:      Height:        CBC:  Recent Labs  Lab 08/13/18 1722 08/13/18 1731 08/15/18 0453  WBC 7.7  --  7.7  NEUTROABS 4.9  --   --   HGB 11.4* 12.6 10.7*  HCT 36.0 37.0 33.4*  MCV 85.7  --  84.6  PLT 386  --  352    Basic Metabolic Panel:  Recent Labs  Lab 08/13/18 1722 08/13/18 1731 08/15/18 0453  NA 140 140 137  K 3.6 3.7 3.4*  CL 106 106 103  CO2 25  --  24  GLUCOSE 78 75 131*  BUN 14 14 13   CREATININE 1.24* 1.30* 1.41*  CALCIUM 9.5  --  9.3    Lipid Panel:     Component Value Date/Time   CHOL 138 08/15/2018 0453   TRIG 63 08/15/2018 0453   HDL 42 08/15/2018 0453   CHOLHDL 3.3 08/15/2018 0453   VLDL 13 08/15/2018 0453   LDLCALC 83 08/15/2018 0453   HgbA1c: No results found for: HGBA1C Urine Drug Screen:     Component Value Date/Time   LABOPIA NONE DETECTED 08/14/2018 0829   COCAINSCRNUR NONE DETECTED 08/14/2018 0829   LABBENZ NONE DETECTED 08/14/2018 0829   AMPHETMU NONE DETECTED 08/14/2018 0829   THCU NONE DETECTED 08/14/2018 0829   LABBARB NONE DETECTED 08/14/2018 0829    Alcohol Level     Component Value Date/Time   ETH <10 08/13/2018 1722    IMAGING  Ct Angio Head W Or Wo Contrast Ct Angio Neck W Or Wo Contrast Ct Cerebral  Perfusion W Contrast 08/13/2018 IMPRESSION:  1. 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  2. The area of reduced cerebral blood flow on the CT perfusion corresponds to a remote infarct and is likely artifactual. The area of acute ischemia on the MRI is not demonstrated on the CT perfusion exam.  3. Large area of ischemic penumbra involving the residual left MCA territory, likely secondary to the high-grade stenoses proximally.  4. 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  5. Tortuosity of the cervical internal carotid arteries bilaterally without significant stenosis.  6. 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    Mr Brain Wo Contrast (neuro Protocol) 08/13/2018 IMPRESSION:  1. Mild motion degraded examination. Acute to subacute LEFT parietal/MCA territory infarct.  2. 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  3. Mild-to-moderate chronic small vessel ischemic changes.     Transthoracic Echocardiogram  08/14/2018 Study Conclusions - Left ventricle: The cavity size was normal. Wall thickness was   normal. Systolic  function was vigorous. The estimated ejection   fraction was in the range of 65% to 70%. Wall motion was normal;   there were no regional wall motion abnormalities. Doppler   parameters are consistent with abnormal left ventricular   relaxation (grade 1 diastolic dysfunction). Impressions: - No cardiac source of emboli was indentified.  EEG This is an abnormal electroencephalogram secondary to left hemispheric intermittent slowing which is consistent with the patient's history of left hemispheric infarcts.    DSA 1.Severe irregular seg stenosis of RT MCA M1 seg. 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 3.Severe 90 5 + stenosis of Lt VA origin 4.Severe LT PCA P1 stenosis. 5.Mod RT PCA P1 stenosis. 6.Approx 40 % LT ICA cavenous  seg stenosis   PHYSICAL EXAM  Temp:  [97.4 F (36.3 C)-98.9 F (37.2 C)] 97.4 F (36.3 C) (11/23 0508) Pulse Rate:  [72-88] 88 (11/23 0508) Resp:  [16-20] 18 (11/23 0508) BP: (122-151)/(70-94) 123/77 (11/23 0508) SpO2:  [97 %-100 %] 99 % (11/23 0508)  General - Well nourished, well developed, in no apparent distress.  Ophthalmologic - fundi not visualized due to noncooperation.  Cardiovascular - Regular rate and rhythm.  Neuro - awake, alert, sitting in chair, orientated to self, and people, not orientated to time.  Not able to tell me about her past and current stroke events.  Able to follow simple commands, able to name 3 out of 3, able to repeat, mild to moderate dysarthria.  Visual field full.  PERRL, EOMI.  Mild right facial droop, tongue midline.  Sensation symmetrical bilateral face.  Left upper and lower extremity 5/5 muscle strength, right upper extremity 3+/5 proximal, 3/5 distally.  Right lower extremity 4/5 proximal and 3/5 distally.  Incision symmetrical.  Bilateral finger-to-nose no ataxia but slow in action.  Gait not tested.   ASSESSMENT/PLAN [name] is a 47 y.o. female with history of previous stroke, Htn, and DM presenting with increased right sided weakness. She did not receive IV t-PA due to late presentation.  Recurrent strokes: Lt MCA small acute on large chronic infarct - likely due to left cavernous ICA and MCA high-grade stenosis.    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 Multiple old infarcts involving left ACA and left anterior and posterior frontal MCA.  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   Cerebral angiogram showed severe left M1 stenosis, bilateral P1  stenosis, left AV CEA occluded, left ICA cavernous 40% stenosis.  Plan for left M1 stenting on Tuesday with Dr. Corliss Skainseveshwar  2D Echo  - EF 65 - 70%. No cardiac source of emboli identified.   LDL - 83  HgbA1c 6.9  Hypercoagulable and autoimmune work-up pending  UDS - negative  EEG - left hemispheric intermittent slowing, no seizure  VTE prophylaxis - SCDs  Diet - Heart healthy / carb modified with thin liquids.  aspirin 81 mg daily and clopidogrel 75 mg daily for more than a year prior to admission, now on aspirin 325 mg daily and clopidogrel 75 mg daily given severe intracranial stenosis.  Continue on discharge  Patient counseled to be compliant with her antithrombotic medications  Ongoing aggressive stroke risk factor management  Therapy recommendations:  pending  Disposition:  Pending  Complex partial seizure  For the past week, patient had right arm shaking jerking with tonic flexion lasting 15 to 30 seconds, followed by Todd's paralysis for 30 minutes also associate with staring spells.  Had Keppra load and now on Keppra 500 twice daily  EEG no seizure but left hemispheric intermittent slowing  Continue Keppra for seizure control on discharge  History of stroke  2017 while she was working and did not feel good.  She was admitted for 3 to 4 days for headache and high BP and was told to have a stroke due to high BP and diabetes not in good control.  No deficit residual.    Second stroke in 2018 with right-sided weakness and speech difficulty, admitted to hospital for 5 days.  Was told due to blood pressure and diabetes and a stressful job.  Discharged on aspirin and Plavix which was continued until now.    one-week she was admitted again in [facility] with worsening right-sided weakness and speech difficulty, had MRI/CT at [facility] showed new MCA stroke and was discharged with outpatient neurology follow-up which was not able to  set up due to appointment will be in January.    ?? Need to rule out CNS vasculitis   less likely given large vessel involvement instead of medium size or small size vessels  No typical presentation with headache and intermittent cognitive impairment  DSA suggested atherosclerosis disease instead of CNS vasculitis  Not a candidate for LP at this time given long-term Plavix use.   We will set outpatient follow-up with Dr. Pearlean Brownie at [facility] in 4 weeks.  Hypertension  Stable . Long-term BP goal 130-150 given severe intracranial stenosis  Hyperlipidemia  Lipid lowering medication PTA:  Lipitor 80 mg daily and Zetia 10 mg daily  LDL 83, goal < 70  Current lipid lowering medication: Lipitor 80 mg daily and Zetia 10 mg daily  Continue statin at discharge  Diabetes  HgbA1c 6.9, goal < 7.0  Controlled  Home meds includes insulin and alimepiride  Glucose under control in the hospital  SSI  CBG monitoring  Other Stroke Risk Factors  Obesity, Body mass index is 34.52 kg/m., recommend weight loss, diet and exercise as appropriate   Other Active Problems  Elevated creatinine 1.30->1.41  Cognitive impairment - residual deficit from previous stroke  Hospital day # 1  Marvel Plan, MD PhD Stroke Neurology 08/15/2018 5:27 PM   To contact Stroke Continuity provider, please refer to [email or web address]. After hours, contact General Neurology

## 2018-08-15 NOTE — Sedation Documentation (Signed)
Vital signs stable. 

## 2018-08-15 NOTE — Sedation Documentation (Signed)
Patient is resting comfortably. MD at bedside, procedure started

## 2018-08-16 DIAGNOSIS — I679 Cerebrovascular disease, unspecified: Secondary | ICD-10-CM

## 2018-08-16 LAB — BASIC METABOLIC PANEL
Anion gap: 9 (ref 5–15)
BUN: 14 mg/dL (ref 6–20)
CALCIUM: 9 mg/dL (ref 8.9–10.3)
CO2: 21 mmol/L — ABNORMAL LOW (ref 22–32)
CREATININE: 1.22 mg/dL — AB (ref 0.44–1.00)
Chloride: 106 mmol/L (ref 98–111)
GFR calc Af Amer: 59 mL/min — ABNORMAL LOW (ref >60)
GFR, EST NON AFRICAN AMERICAN: 51 mL/min — AB (ref >60)
GLUCOSE: 123 mg/dL — AB (ref 70–99)
Potassium: 3.6 mmol/L (ref 3.5–5.1)
SODIUM: 136 mmol/L (ref 135–145)

## 2018-08-16 LAB — GLUCOSE, CAPILLARY
GLUCOSE-CAPILLARY: 127 mg/dL — AB (ref 70–99)
Glucose-Capillary: 122 mg/dL — ABNORMAL HIGH (ref 70–99)
Glucose-Capillary: 147 mg/dL — ABNORMAL HIGH (ref 70–99)
Glucose-Capillary: 157 mg/dL — ABNORMAL HIGH (ref 70–99)

## 2018-08-16 LAB — CBC
HCT: 33.5 % — ABNORMAL LOW (ref 36.0–46.0)
Hemoglobin: 10.3 g/dL — ABNORMAL LOW (ref 12.0–15.0)
MCH: 26.1 pg (ref 26.0–34.0)
MCHC: 30.7 g/dL (ref 30.0–36.0)
MCV: 85 fL (ref 80.0–100.0)
PLATELETS: 351 10*3/uL (ref 150–400)
RBC: 3.94 MIL/uL (ref 3.87–5.11)
RDW: 13.6 % (ref 11.5–15.5)
WBC: 7.4 10*3/uL (ref 4.0–10.5)
nRBC: 0 % (ref 0.0–0.2)

## 2018-08-16 LAB — MPO/PR-3 (ANCA) ANTIBODIES

## 2018-08-16 LAB — HOMOCYSTEINE: Homocysteine: 19.4 umol/L — ABNORMAL HIGH (ref 0.0–15.0)

## 2018-08-16 LAB — RHEUMATOID FACTOR: RHEUMATOID FACTOR: 14.6 [IU]/mL — AB (ref 0.0–13.9)

## 2018-08-16 LAB — MAGNESIUM: MAGNESIUM: 2.2 mg/dL (ref 1.7–2.4)

## 2018-08-16 NOTE — Progress Notes (Signed)
STROKE TEAM PROGRESS NOTE   SUBJECTIVE (INTERVAL HISTORY) Her parents are at bedside.  She is sitting in chair, no complains. Parents said pt was able to walk with PT/OT in the hallway. Still has right foot droop and right arm weakness. No seizure activity. Planning for right MCA stenting on Tuesday.  OBJECTIVE Vitals:   08/15/18 1215 08/15/18 2007 08/15/18 2350 08/16/18 0429  BP: (!) 146/81 (!) 145/85 132/82 139/75  Pulse: 89 90 80 77  Resp: 18 16 12 16   Temp: 98.9 F (37.2 C) 98 F (36.7 C) 98.2 F (36.8 C) 98 F (36.7 C)  TempSrc: Oral Oral Oral Oral  SpO2: 100% 99% 100% 98%  Weight:      Height:        CBC:  Recent Labs  Lab 08/13/18 1722  08/15/18 0453 08/16/18 0421  WBC 7.7  --  7.7 7.4  NEUTROABS 4.9  --   --   --   HGB 11.4*   < > 10.7* 10.3*  HCT 36.0   < > 33.4* 33.5*  MCV 85.7  --  84.6 85.0  PLT 386  --  352 351   < > = values in this interval not displayed.    Basic Metabolic Panel:  Recent Labs  Lab 08/15/18 0453 08/16/18 0421  NA 137 136  K 3.4* 3.6  CL 103 106  CO2 24 21*  GLUCOSE 131* 123*  BUN 13 14  CREATININE 1.41* 1.22*  CALCIUM 9.3 9.0  MG  --  2.2    Lipid Panel:     Component Value Date/Time   CHOL 138 08/15/2018 0453   TRIG 63 08/15/2018 0453   HDL 42 08/15/2018 0453   CHOLHDL 3.3 08/15/2018 0453   VLDL 13 08/15/2018 0453   LDLCALC 83 08/15/2018 0453   HgbA1c:  Lab Results  Component Value Date   HGBA1C 6.9 (H) 08/15/2018   Urine Drug Screen:     Component Value Date/Time   LABOPIA NONE DETECTED 08/14/2018 0829   COCAINSCRNUR NONE DETECTED 08/14/2018 0829   LABBENZ NONE DETECTED 08/14/2018 0829   AMPHETMU NONE DETECTED 08/14/2018 0829   THCU NONE DETECTED 08/14/2018 0829   LABBARB NONE DETECTED 08/14/2018 0829    Alcohol Level     Component Value Date/Time   ETH <10 08/13/2018 1722    IMAGING  Ct Angio Head W Or Wo Contrast Ct Angio Neck W Or Wo Contrast Ct Cerebral Perfusion W  Contrast 08/13/2018 IMPRESSION:  1. 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  2. The area of reduced cerebral blood flow on the CT perfusion corresponds to a remote infarct and is likely artifactual. The area of acute ischemia on the MRI is not demonstrated on the CT perfusion exam.  3. Large area of ischemic penumbra involving the residual left MCA territory, likely secondary to the high-grade stenoses proximally.  4. 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  5. Tortuosity of the cervical internal carotid arteries bilaterally without significant stenosis.  6. 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    Mr Brain Wo Contrast (neuro Protocol) 08/13/2018 IMPRESSION:  1. Mild motion degraded examination. Acute to subacute LEFT parietal/MCA territory infarct.  2. 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  3. Mild-to-moderate chronic small vessel ischemic changes.     Transthoracic Echocardiogram  08/14/2018 Study Conclusions - Left ventricle: The cavity size was normal.  Wall thickness was   normal. Systolic function was vigorous. The estimated ejection   fraction was in the range of 65% to 70%. Wall motion was normal;   there were no regional wall motion abnormalities. Doppler   parameters are consistent with abnormal left ventricular   relaxation (grade 1 diastolic dysfunction). Impressions: - No cardiac source of emboli was indentified.  EEG This is an abnormal electroencephalogram secondary to left hemispheric intermittent slowing which is consistent with the patient's history of left hemispheric infarcts.    DSA 1.Severe irregular seg stenosis of RT MCA M1 seg. 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 3.Severe 90 5 + stenosis of Lt VA origin 4.Severe LT PCA P1 stenosis. 5.Mod RT PCA P1 stenosis. 6.Approx 40 % LT ICA cavenous seg  stenosis   PHYSICAL EXAM  Temp:  [98 F (36.7 C)-98.9 F (37.2 C)] 98 F (36.7 C) (11/24 0429) Pulse Rate:  [77-91] 77 (11/24 0429) Resp:  [10-20] 16 (11/24 0429) BP: (132-174)/(75-114) 139/75 (11/24 0429) SpO2:  [98 %-100 %] 98 % (11/24 0429)  General - Well nourished, well developed, in no apparent distress.  Ophthalmologic - fundi not visualized due to noncooperation.  Cardiovascular - Regular rate and rhythm.  Neuro - awake, alert, sitting in chair, orientated to self, and people, not orientated to time.  Not able to tell me about her past and current stroke events.  Able to follow simple commands, able to name 3 out of 3, able to repeat, paucity of speech, mild to moderate dysarthria.  Visual field full.  PERRL, EOMI.  Mild right facial droop, tongue midline.  Sensation symmetrical bilateral face.  Left upper and lower extremity 5/5 muscle strength, right upper extremity 3+/5 proximal, 3/5 distally.  Right lower extremity 5-/5 proximal and 3+/5 foot DF.  Sensation symmetrical.  Bilateral finger-to-nose no ataxia but slow in action.  Gait not tested.   ASSESSMENT/PLAN Julia Ferguson is a 47 y.o. female with history of previous stroke, Htn, and DM presenting with increased right sided weakness. She did not receive IV t-PA due to late presentation.  Recurrent strokes: Lt MCA small acute on large chronic infarct - likely due to left cavernous ICA and MCA high-grade stenosis.    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 Multiple old infarcts involving left ACA and left anterior and posterior frontal MCA.  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   Cerebral angiogram showed severe left M1  stenosis, bilateral P1 stenosis, left AV CEA occluded, left ICA cavernous 40% stenosis.  Plan for left M1 stenting on Tuesday with Dr. Corliss Skains  2D Echo  - EF 65 - 70%. No cardiac source of emboli identified.   LDL - 83  HgbA1c 6.9  Hypercoagulable and autoimmune work-up so far negative.   UDS - negative  EEG - left hemispheric intermittent slowing, no seizure  VTE prophylaxis - SCDs  Diet - Heart healthy / carb modified with thin liquids.  aspirin 81 mg daily and clopidogrel 75 mg daily for more than a year prior to admission, now on aspirin 325 mg daily and clopidogrel 75 mg daily given severe intracranial stenosis.  Continue on discharge  Patient counseled to be compliant with her antithrombotic medications  Ongoing  aggressive stroke risk factor management  Therapy recommendations:  pending  Disposition:  Pending  Complex partial seizure  For the past week, patient had right arm shaking jerking with tonic flexion lasting 15 to 30 seconds, followed by Todd's paralysis for 30 minutes also associate with staring spells.  Had Keppra load and now on Keppra 500 twice daily  EEG no seizure but left hemispheric intermittent slowing  Continue Keppra for seizure control on discharge  No recurrent seizure like episodes so far  History of stroke  2017 while she was working and did not feel good.  She was admitted for 3 to 4 days for headache and high BP and was told to have a stroke due to high BP and diabetes not in good control.  No deficit residual.    Second stroke in 2018 with right-sided weakness and speech difficulty, admitted to hospital for 5 days.  Was told due to blood pressure and diabetes and a stressful job.  Discharged on aspirin and Plavix which was continued until now.    [facility]-week [facility]-sided weakness and speech difficulty, [facility]  stroke and was discharged with outpatient neurology follow-up which was not able to set up due to appointment will be in January.    ?? Need to rule out CNS vasculitis   less likely given large vessel involvement instead of medium size or small size vessels  No typical presentation with headache and intermittent cognitive impairment  DSA suggested atherosclerosis disease instead of CNS vasculitis  Not a candidate for LP at this time given long-term Plavix use.   We will [facility].  Hypertension  Stable . Long-term BP goal 130-150 given severe intracranial stenosis  Hyperlipidemia  Lipid lowering medication PTA:  Lipitor 80 mg daily and Zetia 10 mg daily  LDL 83, goal < 70  Current lipid lowering medication: Lipitor 80 mg daily and Zetia 10 mg daily  Continue statin at discharge  Diabetes, type II  HgbA1c 6.9, goal < 7.0  Controlled  Home meds includes insulin and glimepiride  Glucose under control in the hospital  SSI  CBG monitoring  Other Stroke Risk Factors  Obesity, Body mass index is 34.52 kg/m., recommend weight loss, diet and exercise as appropriate   Other Active Problems  Elevated creatinine 1.30->1.41->1.22  Cognitive impairment - residual deficit from previous stroke  Hospital day # 2  Julia PlanJindong Munirah Doerner, MD PhD Stroke Neurology 08/16/2018 3:47 PM    To contact Stroke Continuity provider, please refer to [email or web address]. After hours, contact General Neurology

## 2018-08-16 NOTE — Evaluation (Addendum)
Occupational Therapy Evaluation Patient Details Name: Julia Ferguson MRN: [id number] DOB: 1970-12-28 Today's Date: 08/16/2018    History of Present Illness 47yo female brought to the ED due to spasm/jerking R UE, twitching and turning face to L, staring of 1 week duration, slurred speech and R sided weakness. No LOC. 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 No tpa given. PMH DM, HTN, CVA with R residual deficits    Clinical Impression   PTA, pt was living with her son and had family support 24/7 (aunt, sister, and mother) to assist with ADLs and IADLs. Pt currently requiring Mod A for UB ADLs, Max A for LB ADLs, and Min A for functional mobility with RW. Pt presenting with decreased strength, balance, and activity tolerance than baseline function. Pt would benefit from further acute OT to facilitate safe dc. Recommend dc to home with HHOT for further OT to optimize safety, independence with ADLs, and return to PLOF.       Follow Up Recommendations  Home health OT;Supervision/Assistance - 24 hour    Equipment Recommendations  None recommended by OT    Recommendations for Other Services PT consult     Precautions / Restrictions Precautions Precautions: Fall Precaution Comments: R residual deficits from previous CVA, foot drop, severe neuropathy B feet  Restrictions Weight Bearing Restrictions: No      Mobility Bed Mobility               General bed mobility comments: up in chair upon PT arrival.   Transfers Overall transfer level: Needs assistance Equipment used: Rolling walker (2 wheeled) Transfers: Sit to/from Stand Sit to Stand: Min assist         General transfer comment: Min A to power up and for safe descent to recliner     Balance Overall balance assessment: Needs assistance Sitting-balance support: Feet supported;No upper  extremity supported Sitting balance-Leahy Scale: Fair     Standing balance support: Bilateral upper extremity supported;During functional activity Standing balance-Leahy Scale: Poor Standing balance comment: reliance on B UE support                           ADL either performed or assessed with clinical judgement   ADL Overall ADL's : Needs assistance/impaired Eating/Feeding: Minimal assistance;Sitting   Grooming: Minimal assistance;Sitting   Upper Body Bathing: Moderate assistance;Sitting   Lower Body Bathing: Maximal assistance;Sit to/from stand   Upper Body Dressing : Moderate assistance;Sitting Upper Body Dressing Details (indicate cue type and reason): donned second gown like jacket Lower Body Dressing: Maximal assistance;Sit to/from stand;With caregiver independent assisting Lower Body Dressing Details (indicate cue type and reason): Pt's mother donning her shoes Toilet Transfer: Minimal assistance;RW;Ambulation(simulated to recliner) Toilet Transfer Details (indicate cue type and reason): Min A to power up into standing and then Min A for safety descent         Functional mobility during ADLs: Minimal assistance;Rolling walker General ADL Comments: Pt presenting with increased weakness compared to baseline     Vision         Perception     Praxis      Pertinent Vitals/Pain Pain Assessment: No/denies pain     Hand Dominance Right(Right hemi from prior CVA)   Extremity/Trunk Assessment Upper Extremity Assessment Upper Extremity Assessment: RUE deficits/detail RUE Deficits / Details: Right hemi at baseline.  Pt and family reporting RUE is weaker than at baseline. Noted edema at hand and arm. Limited ROM at hand, wrist, elbow, and shoulder. Poor grasp. Pt using LUE to assist RUE during functional tasks.  RUE Coordination: decreased fine motor;decreased gross motor   Lower Extremity Assessment Lower Extremity Assessment: Defer to PT evaluation    Cervical / Trunk Assessment Cervical / Trunk Assessment: Kyphotic   Communication Communication Communication: Expressive difficulties   Cognition Arousal/Alertness: Awake/alert Behavior During Therapy: WFL for tasks assessed/performed Overall Cognitive Status: History of cognitive impairments - at baseline                                 General Comments: Pt requiring increased time for processing. minimal verbalizations. "so-so" to most questions asked. Oriented to place, time, and situation. increased cues and time required   General Comments  Parents present during session. Sitting BP 14    Exercises     Shoulder Instructions      Home Living Family/patient expects to be discharged to:: Private residence Living Arrangements: Other relatives;Parent(Son. Aunt and mother come in and do ADLs/IADLs) Available Help at Discharge: Family;Available 24 hours/day Type of Home: House Home Access: Ramped entrance     Home Layout: Two level;Able to live on main level with bedroom/bathroom     Bathroom Shower/Tub: Tub/shower unit   Bathroom Toilet: Handicapped height     Home Equipment: Environmental consultant - 2 wheels;Cane - single point;Toilet riser;Wheelchair - manual          Prior Functioning/Environment Level of Independence: Needs assistance  Gait / Transfers Assistance Needed: Able to perform short distance functional mobility with RW and supervision from family ADL's / Homemaking Assistance Needed: Family assists with dressing/bathing            OT Problem List: Decreased strength;Decreased range of motion;Decreased activity tolerance;Impaired balance (sitting and/or standing);Decreased knowledge of use of DME or AE;Decreased knowledge of precautions;Decreased cognition      OT Treatment/Interventions: Self-care/ADL training;Therapeutic exercise;Energy conservation;DME and/or AE instruction;Therapeutic activities;Patient/family education    OT Goals(Current  goals can be found in the care plan section) Acute Rehab OT Goals Patient Stated Goal: go home, feel better  OT Goal Formulation: With patient Time For Goal Achievement: 08/30/18 Potential to Achieve Goals: Good ADL Goals Pt Will Perform Grooming: with set-up;with supervision;sitting;standing Pt Will Perform Upper Body Dressing: with min assist;sitting Pt Will Transfer to Toilet: with set-up;with supervision;ambulating;bedside commode  OT Frequency: Min 2X/week   Barriers to D/C:            Co-evaluation              AM-PAC OT "6 Clicks" Daily Activity     Outcome Measure Help from another person eating meals?: A Little Help from another person taking care of personal grooming?: A Little Help from another person toileting, which includes using toliet, bedpan, or urinal?: A Little Help from another person bathing (including washing, rinsing, drying)?: A Lot Help from another person to put on and taking off regular upper body clothing?: A Little Help from another person to put on and taking off regular lower body clothing?: A Lot 6 Click Score: 16   End of Session Equipment Utilized During Treatment: Rolling walker;Gait belt Nurse Communication: Mobility status  Activity Tolerance: Patient tolerated treatment well Patient left: in chair;with call bell/phone within reach;with chair alarm set;with family/visitor present  OT Visit Diagnosis: Unsteadiness on feet (R26.81);Other abnormalities  of gait and mobility (R26.89);Muscle weakness (generalized) (M62.81)                Time: 2542-70621343-1403 OT Time Calculation (min): 20 min Charges:  OT General Charges $OT Visit: 1 Visit OT Evaluation $OT Eval Moderate Complexity: 1 Mod  Marrio Scribner MSOT, OTR/L Acute Rehab Pager: [phone] Office: [phone]  Theodoro GristCharis M Avion Kutzer 08/16/2018, 4:58 PM

## 2018-08-16 NOTE — Care Management Note (Signed)
Case Management Note  Patient Details  Name: Phoebe Sharpsammel Yahnke MRN: [id number] Date of Birth: 03/15/1971  Subjective/Objective:                    Action/Plan:  Spoke to patient and mostly mother at bedside. She provides that patient is [facility] out of [place]. Patient has RW, quad cane, 3/1, Wc, tub bench at home. Patient lives at home with her 49 year old son. Her son, parents, and other family members provide 24 hour supervision for her.   Will need resumption orders faced to [facility]. Family denies need for additional DME at DC.   Expected Discharge Date:                  Expected Discharge Plan:     In-House Referral:     Discharge planning Services  CM Consult  Post Acute Care Choice:    Choice offered to:  Parent, Patient  DME Arranged:    DME Agency:     HH Arranged:    HH Agency:  ([facility] in [place])  Status of Service:  In process, will continue to follow  If discussed at Long Length of Stay Meetings, dates discussed:    Additional Comments:  Lawerance SabalDebbie Hawkin Charo, RN 08/16/2018, 11:37 AM

## 2018-08-16 NOTE — Progress Notes (Addendum)
Physical Therapy Treatment Patient Details Name: Julia Ferguson Base MRN: [id number] DOB: 05/31/1971 Today's Date: 08/16/2018    History of Present Illness 47yo female brought to the ED due to spasm/jerking R UE, twitching and turning face to L, staring of 1 week duration, slurred speech and R sided weakness. No LOC. 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 No tpa given. PMH DM, HTN, CVA with R residual deficits     PT Comments    Patient progressing well towards PT goals. Tolerated gait training with min guard for safety. Requires some assist to stand from chair with increased time. Pt demonstrates bil foot drop and circumducts to advance RLE during gait. Per mom, pt just started HHPT PTA and plans to resume once home. Pt with dizziness initially in standing and is positive for orthostatic hypotension. This improved with activity. See BP below. Sitting BP 149/73 Standing BP 128/88 Will continue to follow and progress as tolerated.     Follow Up Recommendations  Home health PT;Supervision/Assistance - 24 hour     Equipment Recommendations  None recommended by PT    Recommendations for Other Services       Precautions / Restrictions Precautions Precautions: Fall Precaution Comments: R residual deficits from previous CVA, foot drop, severe neuropathy B feet  Restrictions Weight Bearing Restrictions: No    Mobility  Bed Mobility               General bed mobility comments: up in chair upon PT arrival.   Transfers Overall transfer level: Needs assistance Equipment used: Rolling walker (2 wheeled) Transfers: Sit to/from Stand Sit to Stand: Min assist         General transfer comment: Min A to power to standing with cues for hand placement. Stood from Orthoptistchair x1.   Ambulation/Gait Ambulation/Gait assistance: Min guard Gait Distance (Feet):  40 Feet Assistive device: Rolling walker (2 wheeled) Gait Pattern/deviations: Step-to pattern;Decreased step length - left;Decreased stance time - right;Decreased dorsiflexion - right;Decreased dorsiflexion - left;Decreased weight shift to right;Trunk flexed;Narrow base of support Gait velocity: decreased    General Gait Details: Slow, steady gait with pt using hip hike and circumduction to advance RLE; right ankle instability with foot going into supination. Does not have AFO here. No dizziness reported.    Stairs             Wheelchair Mobility    Modified Rankin (Stroke Patients Only) Modified Rankin (Stroke Patients Only) Pre-Morbid Rankin Score: Moderately severe disability Modified Rankin: Moderately severe disability     Balance Overall balance assessment: Needs assistance Sitting-balance support: Feet supported;No upper extremity supported Sitting balance-Leahy Scale: Good     Standing balance support: Bilateral upper extremity supported;During functional activity Standing balance-Leahy Scale: Poor Standing balance comment: reliance on B UE support                            Cognition Arousal/Alertness: Awake/alert Behavior During Therapy: WFL for tasks assessed/performed Overall Cognitive Status: History of cognitive impairments - at baseline                                 General Comments: Pt requiring increased time for processing. minimal verbalizations. "so-so" to most questions asked. Oriented to place, time, and situation. increased cues  and time required      Exercises      General Comments General comments (skin integrity, edema, etc.): Parents present during session.       Pertinent Vitals/Pain Pain Assessment: No/denies pain    Home Living Family/patient expects to be discharged to:: Private residence Living Arrangements: Other relatives;Parent(Son. Aunt and mother come in and do ADLs/IADLs) Available Help at  Discharge: Family;Available 24 hours/day Type of Home: House Home Access: Ramped entrance   Home Layout: Two level;Able to live on main level with bedroom/bathroom Home Equipment: Dan Humphreys - 2 wheels;Cane - single point;Toilet riser;Wheelchair - manual      Prior Function Level of Independence: Needs assistance  Gait / Transfers Assistance Needed: Able to perform short distance functional mobility with RW and supervision from family ADL's / Homemaking Assistance Needed: Family assists with dressing/bathing     PT Goals (current goals can now be found in the care plan section) Progress towards PT goals: Progressing toward goals    Frequency    Min 3X/week      PT Plan Current plan remains appropriate    Co-evaluation              AM-PAC PT "6 Clicks" Mobility   Outcome Measure  Help needed turning from your back to your side while in a flat bed without using bedrails?: A Little Help needed moving from lying on your back to sitting on the side of a flat bed without using bedrails?: A Little Help needed moving to and from a bed to a chair (including a wheelchair)?: A Little Help needed standing up from a chair using your arms (e.g., wheelchair or bedside chair)?: A Little Help needed to walk in hospital room?: A Little Help needed climbing 3-5 steps with a railing? : A Lot 6 Click Score: 17    End of Session Equipment Utilized During Treatment: Gait belt Activity Tolerance: Patient tolerated treatment well Patient left: in chair;with call bell/phone within reach;with chair alarm set;with family/visitor present Nurse Communication: Mobility status PT Visit Diagnosis: Unsteadiness on feet (R26.81);Muscle weakness (generalized) (M62.81);Other abnormalities of gait and mobility (R26.89)     Time: 8119-1478 PT Time Calculation (min) (ACUTE ONLY): 21 min  Charges:  $Gait Training: 8-22 mins                     Mylo Red, PT, DPT Acute Rehabilitation  Services Pager [phone] Office [phone]       Blake Divine A Lanier Ensign 08/16/2018, 4:20 PM

## 2018-08-16 NOTE — Progress Notes (Signed)
PROGRESS NOTE                                                                                                                                                                                                             Patient Demographics:    Julia Ferguson, is a 47 y.o. female, DOB - Feb 07, 1971, WUJ:[id number]  Admit date - 08/13/2018   Admitting Physician Ejiroghene Wendall Stade, MD  Outpatient Primary MD for the patient is Plunk, Charlsie Quest, FNP  LOS - 2   Chief Complaint  Patient presents with  . Seizures       Brief Narrative    47 y.o. female with medical history significant for CVA, HTN, DM, who was brought to the ED by family with complaints of spasm/jerking of right upper extremity, patient with recent hospitalization at [facility] last week with worsening right-sided weakness and speech difficulty, doing a new stroke, patient was transferred for further work-up.   Subjective:   Patient in bed, appears comfortable, denies any headache, no fever, no chest pain or pressure, no shortness of breath , no abdominal pain. No focal weakness.   Assessment  & Plan :     Subacute CVA -with recent hospitalization to [facility], MRI head showing subacute left parietal CVA in the MCA territory.  Echocardiogram stable, LDL was 83 and A1c was 6.9.  Currently on Plavix and statin combination, discussed with interventional neurology and neurology on 08/15/2018, proceed with stenting procedure on coming Tuesday.  Continue supportive care.  Other work-up or vasculitis work-up per neuro team.   Lab Results  Component Value Date   CHOL 138 08/15/2018   HDL 42 08/15/2018   LDLCALC 83 08/15/2018   TRIG 63 08/15/2018   CHOLHDL 3.3 08/15/2018   Lab Results  Component Value Date   HGBA1C 6.9 (H) 08/15/2018     Hypertension  - Hold home Coreg, losartan, minoxidil in the setting of acute CVA and allow for permissive hypertension  Hyperlipidemia  -  LDL slightly above goal, already on max dose statin will add niacin.   Diabetes mellitus, type II - A1c was 6.9 on 08/08/2018, She is on 45 units GU daily, she was hypoglycemic yesterday on presentation, and her CBGs are controlled, she is going to be n.p.o., so we will continue with sliding scale currently.   CBG (last 3)  Recent Labs    08/15/18 2036 08/16/18 0757 08/16/18 1156  GLUCAP 165* 122* 147*      Code Status : full  Family Communication  : Both parents at bedside on 08/15/2018  Disposition Plan  : Pending PT consult  Barriers For Discharge :   Consults  : Neurology, interventional neurology  Procedures  :   MRI - 1. Mild motion degraded examination. Acute to subacute LEFT parietal/MCA territory infarct. 2. 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 3. Mild-to-moderate chronic small vessel ischemic changes  Angiogram  S/P  4 vessel cerebral arteriogram RT CFA approach. Findings. 1.Severe irregular seg stenosis of RT MCA M1 seg. 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 3.Severe 90 5 + stenosis of Lt VA origin 4.Severe LT PCA P1 stenosis. 5.Mod RT PCA P1 stenosis. 6.Approx 40 % LT ICA cavenous seg stenosis   DVT Prophylaxis  : SCD  Lab Results  Component Value Date   PLT 351 08/16/2018    Antibiotics  :    Anti-infectives (From admission, onward)   None        Objective:   Vitals:   08/15/18 1215 08/15/18 2007 08/15/18 2350 08/16/18 0429  BP: (!) 146/81 (!) 145/85 132/82 139/75  Pulse: 89 90 80 77  Resp: 18 16 12 16   Temp: 98.9 F (37.2 C) 98 F (36.7 C) 98.2 F (36.8 C) 98 F (36.7 C)  TempSrc: Oral Oral Oral Oral  SpO2: 100% 99% 100% 98%  Weight:      Height:        Wt Readings from Last 3 Encounters:  08/14/18 85.6 kg     Intake/Output Summary (Last 24 hours) at 08/16/2018 1204 Last data filed at 08/16/2018 0918 Gross per 24 hour  Intake 1843.94 ml  Output 1200 ml  Net 643.94 ml     Physical  Exam  Awake Alert, Oriented X 3, No new F.N deficits, Mild R. sided arm > leg weakness (4/5) Normal affect [place].AT,PERRAL Supple Neck,No JVD, No cervical lymphadenopathy appriciated.  Symmetrical Chest wall movement, Good air movement bilaterally, CTAB RRR,No Gallops, Rubs or new Murmurs, No Parasternal Heave +ve B.Sounds, Abd Soft, No tenderness, No organomegaly appriciated, No rebound - guarding or rigidity. No Cyanosis, Clubbing or edema, No new Rash or bruise      Data Review:    CBC Recent Labs  Lab 08/13/18 1722 08/13/18 1731 08/15/18 0453 08/16/18 0421  WBC 7.7  --  7.7 7.4  HGB 11.4* 12.6 10.7* 10.3*  HCT 36.0 37.0 33.4* 33.5*  PLT 386  --  352 351  MCV 85.7  --  84.6 85.0  MCH 27.1  --  27.1 26.1  MCHC 31.7  --  32.0 30.7  RDW 14.0  --  13.6 13.6  LYMPHSABS 1.9  --   --   --   MONOABS 0.6  --   --   --   EOSABS 0.2  --   --   --   BASOSABS 0.1  --   --   --     Chemistries  Recent Labs  Lab 08/13/18 1722 08/13/18 1731 08/15/18 0453 08/16/18 0421  NA 140 140 137 136  K 3.6 3.7 3.4* 3.6  CL 106 106 103 106  CO2 25  --  24 21*  GLUCOSE 78 75 131* 123*  BUN 14 14 13 14   CREATININE 1.24* 1.30* 1.41* 1.22*  CALCIUM 9.5  --  9.3 9.0  MG  --   --   --  2.2  AST 16  --   --   --   ALT 19  --   --   --   ALKPHOS 63  --   --   --   BILITOT 1.0  --   --   --    ------------------------------------------------------------------------------------------------------------------ Recent Labs    08/15/18 0453  CHOL 138  HDL 42  LDLCALC 83  TRIG 63  CHOLHDL 3.3    Lab Results  Component Value Date   HGBA1C 6.9 (H) 08/15/2018   ------------------------------------------------------------------------------------------------------------------ No results for input(s): TSH, T4TOTAL, T3FREE, THYROIDAB in the last 72 hours.  Invalid input(s):  FREET3 ------------------------------------------------------------------------------------------------------------------ No results for input(s): VITAMINB12, FOLATE, FERRITIN, TIBC, IRON, RETICCTPCT in the last 72 hours.  Coagulation profile Recent Labs  Lab 08/13/18 1722  INR 0.97    No results for input(s): DDIMER in the last 72 hours.  Cardiac Enzymes No results for input(s): CKMB, TROPONINI, MYOGLOBIN in the last 168 hours.  Invalid input(s): CK ------------------------------------------------------------------------------------------------------------------ No results found for: BNP  Inpatient Medications  Scheduled Meds: . aspirin EC  325 mg Oral Daily  . atorvastatin  80 mg Oral q1800  . chlorhexidine  15 mL Mouth Rinse BID  . clopidogrel  75 mg Oral Daily  . ezetimibe  10 mg Oral Daily  . gabapentin  600 mg Oral QHS  . insulin aspart  0-9 Units Subcutaneous TID WC  . levETIRAcetam  500 mg Oral BID  . mouth rinse  15 mL Mouth Rinse q12n4p  . niacin  100 mg Oral TID WC   Continuous Infusions: . sodium chloride 50 mL/hr at 08/16/18 0937   PRN Meds:.acetaminophen **OR** acetaminophen (TYLENOL) oral liquid 160 mg/5 mL **OR** acetaminophen, fentaNYL, hydrALAZINE, iohexol, labetalol, lidocaine (PF), midazolam, senna-docusate  Micro Results No results found for this or any previous visit (from the past 240 hour(s)).  Radiology Reports Ct Angio Head W Or Wo Contrast  Result Date: 08/13/2018 CLINICAL DATA:  Arterial stricture/occlusion of the head and neck. Weakness beginning 2 weeks ago. Recent admission for stroke. Episodes of shaking and absence staring. EXAM: CT ANGIOGRAPHY HEAD AND NECK CT PERFUSION BRAIN TECHNIQUE: Multidetector CT imaging of the head and neck was performed using the standard protocol during bolus administration of intravenous contrast. Multiplanar CT image reconstructions and MIPs were obtained to evaluate the vascular anatomy. Carotid stenosis  measurements (when applicable) are obtained utilizing NASCET criteria, using the distal internal carotid diameter as the denominator. Multiphase CT imaging of the brain was performed following IV bolus contrast injection. Subsequent parametric perfusion maps were calculated using RAPID software. CONTRAST:  ISOVUE-370 IOPAMIDOL (ISOVUE-370) INJECTION 76% COMPARISON:  MRI brain 08/13/2018 at 4:37 p.m. FINDINGS: CT HEAD FINDINGS Brain: Remote left ACA territory infarct is noted. Additional areas of remote left MCA territory infarcts are present. The areas of more acute infarct are less well delineated on noncontrast CT of the head. The right hemisphere is unremarkable. The brainstem and cerebellum are normal. Vascular: No hyperdense vessel or unexpected calcification. Skull: Calvarium is intact. No focal lytic or blastic lesions are present. Sinuses/Orbits: The paranasal sinuses mastoid air cells are clear. Globes and orbits are within normal limits. ASPECTS (Alberta Stroke Program Early CT Score) - Ganglionic level infarction (caudate, lentiform nuclei, internal capsule, insula, M1-M3 cortex): 7/7 - Supraganglionic infarction (M4-M6 cortex): 3/3 Total score (0-10 with 10 being normal): 10/10 Review of the MIP images confirms the above findings CTA NECK FINDINGS Aortic arch: A three-vessel arch configuration is present. There is no  significant calcification at the aortic arch or stenosis. Right carotid system: Right common carotid artery is within normal limits. Right ICA bifurcation scratched at the right carotid bifurcation is normal. There is tortuosity of the distal right ICA without significant stenosis. Left carotid system: The left common carotid artery is within normal limits. Bifurcation is unremarkable. There is moderate tortuosity of the distal cervical ICA without significant stenosis. Vertebral arteries: The vertebral arteries originate from the subclavian arteries bilaterally. There is a high-grade  stenosis of the left vertebral artery at its origin. The right vertebral artery is the dominant vessel. No other focal stenosis or vascular injury is present. Skeleton: Vertebral body heights alignment are maintained. 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 No focal lytic or blastic lesions are present. Hyperostosis is noted. Other neck: Subcentimeter nodules are present within the right lobe of the thyroid. No dominant lesion is present. No follow-up is necessary. No significant cervical adenopathy is present. Salivary glands are within normal limits. No focal mucosal lesions are present. Upper chest: There is dependent atelectasis at the lung apices. Mild interstitial coarsening suggests mild edema. Review of the MIP images confirms the above findings CTA HEAD FINDINGS Anterior circulation: Atherosclerotic changes are present within the cavernous internal carotid arteries bilaterally. The lumen of the right ICA is narrowed to 1.5 mm. This compares to more distal vessel of 3 mm. There is a high-grade stenosis of the paraclinoid left ICA. 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 This compares with 2.6 mm at the bifurcation. More mild atherosclerotic changes are present in the right M1 segment. Right MCA bifurcation is intact. Left MCA bifurcation is intact. There is some attenuation of distal MCA branch vessels. Chronic left ACA occlusion is noted. Posterior circulation: The right vertebral artery is the dominant vessel. PICA origins are visualized and normal. Vertebrobasilar junction is normal. Basilar artery is within normal limits. Both posterior cerebral arteries originate from the basilar tip. There is moderate narrowing of the proximal P2 segments bilaterally. Distal PCA branch vessels are intact with distal irregularity. Venous sinuses: Dural sinuses are patent. Straight sinus deep cerebral veins are intact. Cortical veins are  within normal limits. Anatomic variants: None Delayed phase: Postcontrast images demonstrate no pathologic enhancement. 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 Review of the MIP images confirms the above findings CT Brain Perfusion Findings: CBF (<30%) Volume: 6mL Perfusion (Tmax>6.0s) volume: 87mL Mismatch Volume: 81mL Infarction Location:Left MCA territory IMPRESSION: 1. 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 2. The area of reduced cerebral blood flow on the CT perfusion corresponds to a remote infarct and is likely artifactual. The area of acute ischemia on the MRI is not demonstrated on the CT perfusion exam. 3. Large area of ischemic penumbra involving the residual left MCA territory, likely secondary to the high-grade stenoses proximally. 4. 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 5. Tortuosity of the cervical internal carotid arteries bilaterally without significant stenosis. 6. 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 Electronically Signed   By: Marin Roberts M.D.   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 Weakness beginning 2 weeks ago. Recent admission for stroke. Episodes of shaking and absence staring. EXAM: CT ANGIOGRAPHY HEAD AND NECK CT  PERFUSION BRAIN TECHNIQUE: Multidetector CT imaging of the head and neck was performed using the standard protocol during bolus administration of intravenous contrast. Multiplanar CT image reconstructions and MIPs were obtained to evaluate the vascular anatomy. Carotid stenosis measurements (when applicable) are obtained utilizing NASCET criteria, using the distal internal carotid diameter as the denominator. Multiphase CT imaging of the brain was performed following IV bolus contrast injection.  Subsequent parametric perfusion maps were calculated using RAPID software. CONTRAST:  ISOVUE-370 IOPAMIDOL (ISOVUE-370) INJECTION 76% COMPARISON:  MRI brain 08/13/2018 at 4:37 p.m. FINDINGS: CT HEAD FINDINGS Brain: Remote left ACA territory infarct is noted. Additional areas of remote left MCA territory infarcts are present. The areas of more acute infarct are less well delineated on noncontrast CT of the head. The right hemisphere is unremarkable. The brainstem and cerebellum are normal. Vascular: No hyperdense vessel or unexpected calcification. Skull: Calvarium is intact. No focal lytic or blastic lesions are present. Sinuses/Orbits: The paranasal sinuses mastoid air cells are clear. Globes and orbits are within normal limits. ASPECTS (Alberta Stroke Program Early CT Score) - Ganglionic level infarction (caudate, lentiform nuclei, internal capsule, insula, M1-M3 cortex): 7/7 - Supraganglionic infarction (M4-M6 cortex): 3/3 Total score (0-10 with 10 being normal): 10/10 Review of the MIP images confirms the above findings CTA NECK FINDINGS Aortic arch: A three-vessel arch configuration is present. There is no significant calcification at the aortic arch or stenosis. Right carotid system: Right common carotid artery is within normal limits. Right ICA bifurcation scratched at the right carotid bifurcation is normal. There is tortuosity of the distal right ICA without significant stenosis. Left carotid system: The left common carotid artery is within normal limits. Bifurcation is unremarkable. There is moderate tortuosity of the distal cervical ICA without significant stenosis. Vertebral arteries: The vertebral arteries originate from the subclavian arteries bilaterally. There is a high-grade stenosis of the left vertebral artery at its origin. The right vertebral artery is the dominant vessel. No other focal stenosis or vascular injury is present. Skeleton: Vertebral body heights alignment are maintained.  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 No focal lytic or blastic lesions are present. Hyperostosis is noted. Other neck: Subcentimeter nodules are present within the right lobe of the thyroid. No dominant lesion is present. No follow-up is necessary. No significant cervical adenopathy is present. Salivary glands are within normal limits. No focal mucosal lesions are present. Upper chest: There is dependent atelectasis at the lung apices. Mild interstitial coarsening suggests mild edema. Review of the MIP images confirms the above findings CTA HEAD FINDINGS Anterior circulation: Atherosclerotic changes are present within the cavernous internal carotid arteries bilaterally. The lumen of the right ICA is narrowed to 1.5 mm. This compares to more distal vessel of 3 mm. There is a high-grade stenosis of the paraclinoid left ICA. 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 This compares with 2.6 mm at the bifurcation. More mild atherosclerotic changes are present in the right M1 segment. Right MCA bifurcation is intact. Left MCA bifurcation is intact. There is some attenuation of distal MCA branch vessels. Chronic left ACA occlusion is noted. Posterior circulation: The right vertebral artery is the dominant vessel. PICA origins are visualized and normal. Vertebrobasilar junction is normal. Basilar artery is within normal limits. Both posterior cerebral arteries originate from the basilar tip. There is moderate narrowing of the proximal P2 segments bilaterally. Distal PCA branch vessels are intact with distal irregularity. Venous sinuses: Dural  sinuses are patent. Straight sinus deep cerebral veins are intact. Cortical veins are within normal limits. Anatomic variants: None Delayed phase: Postcontrast images demonstrate no pathologic enhancement. 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 Review of the MIP images  confirms the above findings CT Brain Perfusion Findings: CBF (<30%) Volume: 6mL Perfusion (Tmax>6.0s) volume: 87mL Mismatch Volume: 81mL Infarction Location:Left MCA territory IMPRESSION: 1. 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 2. The area of reduced cerebral blood flow on the CT perfusion corresponds to a remote infarct and is likely artifactual. The area of acute ischemia on the MRI is not demonstrated on the CT perfusion exam. 3. Large area of ischemic penumbra involving the residual left MCA territory, likely secondary to the high-grade stenoses proximally. 4. 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 5. Tortuosity of the cervical internal carotid arteries bilaterally without significant stenosis. 6. 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 Electronically Signed   By: Marin Roberts M.D.   On: 08/13/2018 19:29   Mr Brain Wo Contrast (neuro Protocol)  Result Date: 08/13/2018 CLINICAL DATA:  Seizure like episodes for 2 weeks. History of stroke, hypertension and diabetes. EXAM: MRI HEAD WITHOUT CONTRAST TECHNIQUE: Multiplanar, multiecho pulse sequences of the brain and surrounding structures were obtained without intravenous contrast. COMPARISON:  None. FINDINGS: Mild motion degraded examination. INTRACRANIAL CONTENTS: Patchy LEFT parietal reduced diffusion with nearly normalized ADC values and bright FLAIR signal. Spurious reduced diffusion mesial LEFT frontoparietal lobes associated with confluent encephalomalacia and susceptibility artifact. Ex vacuo dilatation LEFT lateral ventricle. Patchy LEFT parietooccipital lobe encephalomalacia. Smaller T2 bright LEFT cerebral peduncle and patchy LEFT pontine T2 hyperintense signal consistent with wallerian degeneration. Patchy supratentorial white matter FLAIR T2 hyperintensities. No susceptibility artifact to suggest hemorrhage. The  ventricles and sulci are normal for patient's age. No suspicious parenchymal signal, masses, mass effect. No abnormal extra-axial fluid collections. No extra-axial masses. VASCULAR: Normal major intracranial vascular flow voids present at skull base. SKULL AND UPPER CERVICAL SPINE: No abnormal sellar expansion. No suspicious calvarial bone marrow signal. Craniocervical junction maintained. SINUSES/ORBITS: The mastoid air-cells and included paranasal sinuses are well-aerated.The included ocular globes and orbital contents are non-suspicious. OTHER: None. IMPRESSION: 1. Mild motion degraded examination. Acute to subacute LEFT parietal/MCA territory infarct. 2. 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 3. Mild-to-moderate chronic small vessel ischemic changes. Electronically Signed   By: Awilda Metro M.D.   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 Weakness beginning 2 weeks ago. Recent admission for stroke. Episodes of shaking and absence staring. EXAM: CT ANGIOGRAPHY HEAD AND NECK CT PERFUSION BRAIN TECHNIQUE: Multidetector CT imaging of the head and neck was performed using the standard protocol during bolus administration of intravenous contrast. Multiplanar CT image reconstructions and MIPs were obtained to evaluate the vascular anatomy. Carotid stenosis measurements (when applicable) are obtained utilizing NASCET criteria, using the distal internal carotid diameter as the denominator. Multiphase CT imaging of the brain was performed following IV bolus contrast injection. Subsequent parametric perfusion maps were calculated using RAPID software. CONTRAST:  ISOVUE-370 IOPAMIDOL (ISOVUE-370) INJECTION 76% COMPARISON:  MRI brain 08/13/2018 at 4:37 p.m. FINDINGS: CT HEAD FINDINGS Brain: Remote left ACA territory infarct is noted. Additional areas of remote left MCA  territory infarcts are present. The areas of more  acute infarct are less well delineated on noncontrast CT of the head. The right hemisphere is unremarkable. The brainstem and cerebellum are normal. Vascular: No hyperdense vessel or unexpected calcification. Skull: Calvarium is intact. No focal lytic or blastic lesions are present. Sinuses/Orbits: The paranasal sinuses mastoid air cells are clear. Globes and orbits are within normal limits. ASPECTS (Alberta Stroke Program Early CT Score) - Ganglionic level infarction (caudate, lentiform nuclei, internal capsule, insula, M1-M3 cortex): 7/7 - Supraganglionic infarction (M4-M6 cortex): 3/3 Total score (0-10 with 10 being normal): 10/10 Review of the MIP images confirms the above findings CTA NECK FINDINGS Aortic arch: A three-vessel arch configuration is present. There is no significant calcification at the aortic arch or stenosis. Right carotid system: Right common carotid artery is within normal limits. Right ICA bifurcation scratched at the right carotid bifurcation is normal. There is tortuosity of the distal right ICA without significant stenosis. Left carotid system: The left common carotid artery is within normal limits. Bifurcation is unremarkable. There is moderate tortuosity of the distal cervical ICA without significant stenosis. Vertebral arteries: The vertebral arteries originate from the subclavian arteries bilaterally. There is a high-grade stenosis of the left vertebral artery at its origin. The right vertebral artery is the dominant vessel. No other focal stenosis or vascular injury is present. Skeleton: Vertebral body heights alignment are maintained. 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 No focal lytic or blastic lesions are present. Hyperostosis is noted. Other neck: Subcentimeter nodules are present within the right lobe of the thyroid. No dominant lesion is present. No follow-up is necessary. No significant  cervical adenopathy is present. Salivary glands are within normal limits. No focal mucosal lesions are present. Upper chest: There is dependent atelectasis at the lung apices. Mild interstitial coarsening suggests mild edema. Review of the MIP images confirms the above findings CTA HEAD FINDINGS Anterior circulation: Atherosclerotic changes are present within the cavernous internal carotid arteries bilaterally. The lumen of the right ICA is narrowed to 1.5 mm. This compares to more distal vessel of 3 mm. There is a high-grade stenosis of the paraclinoid left ICA. 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 This compares with 2.6 mm at the bifurcation. More mild atherosclerotic changes are present in the right M1 segment. Right MCA bifurcation is intact. Left MCA bifurcation is intact. There is some attenuation of distal MCA branch vessels. Chronic left ACA occlusion is noted. Posterior circulation: The right vertebral artery is the dominant vessel. PICA origins are visualized and normal. Vertebrobasilar junction is normal. Basilar artery is within normal limits. Both posterior cerebral arteries originate from the basilar tip. There is moderate narrowing of the proximal P2 segments bilaterally. Distal PCA branch vessels are intact with distal irregularity. Venous sinuses: Dural sinuses are patent. Straight sinus deep cerebral veins are intact. Cortical veins are within normal limits. Anatomic variants: None Delayed phase: Postcontrast images demonstrate no pathologic enhancement. 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 Review of the MIP images confirms the above findings CT Brain Perfusion Findings: CBF (<30%) Volume: 6mL Perfusion (Tmax>6.0s) volume: 87mL Mismatch Volume: 81mL Infarction Location:Left MCA territory IMPRESSION: 1. 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 2. The area of  reduced cerebral blood flow on the CT perfusion corresponds to a remote infarct and is likely artifactual. The area of acute ischemia on the MRI is not demonstrated on the CT  perfusion exam. 3. Large area of ischemic penumbra involving the residual left MCA territory, likely secondary to the high-grade stenoses proximally. 4. 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 5. Tortuosity of the cervical internal carotid arteries bilaterally without significant stenosis. 6. 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 Electronically Signed   By: Marin Robertshristopher  Mattern M.D.   On: 08/13/2018 19:29     Signature  Susa RaringPrashant Singh M.D on 08/16/2018 at 12:04 PM   -  To page go to [email or web address] - password [email or web address]

## 2018-08-17 DIAGNOSIS — E1159 Type 2 diabetes mellitus with other circulatory complications: Secondary | ICD-10-CM

## 2018-08-17 DIAGNOSIS — Z794 Long term (current) use of insulin: Secondary | ICD-10-CM

## 2018-08-17 DIAGNOSIS — E785 Hyperlipidemia, unspecified: Secondary | ICD-10-CM

## 2018-08-17 DIAGNOSIS — I10 Essential (primary) hypertension: Secondary | ICD-10-CM

## 2018-08-17 DIAGNOSIS — I6300 Cerebral infarction due to thrombosis of unspecified precerebral artery: Secondary | ICD-10-CM

## 2018-08-17 LAB — GLUCOSE, CAPILLARY
GLUCOSE-CAPILLARY: 118 mg/dL — AB (ref 70–99)
Glucose-Capillary: 124 mg/dL — ABNORMAL HIGH (ref 70–99)

## 2018-08-17 LAB — ANTI-SMITH ANTIBODY

## 2018-08-17 LAB — ANTI-DNA ANTIBODY, DOUBLE-STRANDED

## 2018-08-17 LAB — SJOGRENS SYNDROME-B EXTRACTABLE NUCLEAR ANTIBODY: SSB (La) (ENA) Antibody, IgG: <0.2 AI (ref 0.0–0.9)

## 2018-08-17 LAB — SJOGRENS SYNDROME-A EXTRACTABLE NUCLEAR ANTIBODY

## 2018-08-17 LAB — SICKLE CELL SCREEN: Sickle Cell Screen: NEGATIVE

## 2018-08-17 LAB — PLATELET INHIBITION P2Y12: PLATELET FUNCTION P2Y12: 279 [PRU] (ref 194–418)

## 2018-08-17 LAB — ANTINUCLEAR ANTIBODIES, IFA: ANTINUCLEAR ANTIBODIES, IFA: NEGATIVE

## 2018-08-17 MED ORDER — NIACIN 100 MG PO TABS: 100.0000 mg | ORAL_TABLET | Freq: Three times a day (TID) | ORAL | 0 refills | Status: DC

## 2018-08-17 MED ORDER — CARVEDILOL 6.25 MG PO TABS: 6.2500 mg | ORAL_TABLET | Freq: Two times a day (BID) | ORAL | 0 refills | Status: DC

## 2018-08-17 MED ORDER — PANTOPRAZOLE SODIUM 40 MG PO TBEC: 40.0000 mg | DELAYED_RELEASE_TABLET | Freq: Every day | ORAL | 0 refills | Status: AC

## 2018-08-17 MED ORDER — "INSULIN SYRINGE-NEEDLE U-100 25G X 1"" 1 ML MISC": 0 refills | Status: AC

## 2018-08-17 MED ORDER — TICAGRELOR 90 MG PO TABS
180.0000 mg | ORAL_TABLET | Freq: Once | ORAL | Status: AC
  Administered 2018-08-17: 180 mg via ORAL
  Filled 2018-08-17: qty 2

## 2018-08-17 MED ORDER — INSULIN ASPART 100 UNIT/ML ~~LOC~~ SOLN: SUBCUTANEOUS | 12 refills | Status: AC

## 2018-08-17 MED ORDER — ASPIRIN 325 MG PO TBEC: 325.0000 mg | DELAYED_RELEASE_TABLET | Freq: Every day | ORAL | 1 refills | Status: DC

## 2018-08-17 MED ORDER — TICAGRELOR 60 MG PO TABS: 90.0000 mg | ORAL_TABLET | Freq: Two times a day (BID) | ORAL | 0 refills | Status: DC

## 2018-08-17 NOTE — Care Management Note (Addendum)
Case Management Note  Patient Details  Name: Julia Ferguson MRN: [id number] Date of Birth: 06/06/1971  Subjective/Objective:  Presented with RUE twitching and face turning face.  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 PMH DM, HTN, CVA with R residual deficits. From home alone.     Julia Ferguson (Mother)     [phone]      PCP: Rodena Pietyiffany Plunk  Action/Plan: Transition to home with home health services to resume. Mother states will live with daughter and assist with 24/7 care. Family to provide transportation to home.  Expected Discharge Date:  08/17/18               Expected Discharge Plan:  Home w Home Health Services  In-House Referral:     Discharge planning Services  CM Consult  Post Acute Care Choice:  Resumption of Svcs/PTA Provider Choice offered to:  Parent, Patient  DME Arranged:   N/A DME Agency:   N/A  HH Arranged:  RN,PT,OT,SLP HH Agency:  [facility]   Status of Service:  COMPLETED  If discussed at Long Length of Stay Meetings, dates discussed:    Additional Comments:  Epifanio LeschesCole, Kaetlyn Noa Hudson, RN 08/17/2018, 11:07 AM

## 2018-08-17 NOTE — Discharge Summary (Addendum)
Julia Ferguson WRU:[id number] DOB: July 05, 1971 DOA: 08/13/2018  PCP: Hezzie Bump, FNP  Admit date: 08/13/2018  Discharge date: 08/17/2018  Admitted From: Home   Disposition:  Home   Recommendations for Outpatient Follow-up:   Follow up with PCP in 1-2 weeks  PCP Please obtain BMP/CBC, 2 view CXR in 1week,  (see Discharge instructions)   PCP Please follow up on the following pending results:    Home Health: PT, OT, RN   Equipment/Devices: Walker  Consultations: Neuro, Neur.IR Discharge Condition: Fair  CODE STATUS: Full   Diet Recommendation: Heart Healthy Low Carb    Chief Complaint  Patient presents with  . Seizures     Brief history of present illness from the day of admission and additional interim summary    47 y.o.femalewith medical history significantfor CVA, HTN, DM,who was brought to the ED by family with complaints of spasm/jerking of right upper extremity, patient with recent hospitalization at [facility] last week with worsening right-sided weakness and speech difficulty, doing a new stroke, patient was transferred for further work-up.                                                                 Hospital Course     Subacute CVA -with recent hospitalization to [facility], MRI head showing subacute left parietal CVA in the MCA territory.  Echocardiogram stable, LDL was 83 and A1c was 6.9.  Currently on aspirin, Plavix (switched to Brilinta by interventional radiology) and statin combination, discussed with interventional neurology and neurology on 08/15/2018, due to nonavailability of anesthesia neuro IR recommends home discharge with outpatient follow-up with them within a week for stenting procedure in the outpatient setting.  She has also been requested to follow with neurology  within a month.  Of note she was on 81 of aspirin, Plavix, statin prior to discharge currently she is on full dose aspirin instead of 81 mg.  PPI has been added for GI protection.  She is currently been discharged on full dose aspirin, Brilinta, statin, Zetia and niacin.  She will also get home PT, OT along with a rolling walker.  She has minimal right arm more than right leg weakness otherwise she is fairly stable.   Lab Results  Component Value Date   HGBA1C 6.9 (H) 08/15/2018   Lab Results  Component Value Date   CHOL 138 08/15/2018   HDL 42 08/15/2018   LDLCALC 83 08/15/2018   TRIG 63 08/15/2018   CHOLHDL 3.3 08/15/2018    Hypertension  - blood pressure on the low normal side, will be discharged only on moderate dose Coreg, 3 other blood pressure medications have been discontinued.  PCP to monitor and adjust as needed.  Hyperlipidemia  - LDL slightly above goal, already on max dose statin along with Zetia will  add niacin.   Diabetes mellitus, type II - A1c was 6.9 on 08/08/2018, continue her home long-acting insulin regimen, hold oral hypoglycemic upon discharge as sugars have been stable on low-dose sliding scale, added sliding scale QA CHS with instructions to do Accu-Cheks QA CHS.  Monitor and adjust by PCP.Julia Ferguson     Discharge diagnosis     Principal Problem:   CVA (cerebral vascular accident) (HCC) Active Problems:   HTN (hypertension)   DM (diabetes mellitus) [facility])    Discharge instructions    Discharge Instructions    Ambulatory referral to Neurology   Complete by:  As directed    Follow up with stroke clinic NP (Jessica Vanschaick or Darrol Angel, if both not available, consider Dr. Delia Heady, Dr. Jamelle Rushing, or Dr. Naomie Dean) at [facility] in about         6 weeks.   Discharge instructions   Complete by:  As directed    Follow with Primary MD Plunk, Charlsie Quest, FNP in 7 days   Get CBC, CMP, 2 view Chest X ray -  checked  by  Primary MD  in 5-7 days   Activity: As tolerated with Full fall precautions use walker/cane & assistance as needed  Disposition Home    Diet: Heart Healthy  Low Carb, check CBGs QAC-HS  Special Instructions: If you have smoked or chewed Tobacco  in the last 2 yrs please stop smoking, stop any regular Alcohol  and or any Recreational drug use.  On your next visit with your primary care physician please Get Medicines reviewed and adjusted.  Please request your Prim.MD to go over all Hospital Tests and Procedure/Radiological results at the follow up, please get all Hospital records sent to your Prim MD by signing hospital release before you go home.  If you experience worsening of your admission symptoms, develop shortness of breath, life threatening emergency, suicidal or homicidal thoughts you must seek medical attention immediately by calling 911 or calling your MD immediately  if symptoms less severe.  You Must read complete instructions/literature along with all the possible adverse reactions/side effects for all the Medicines you take and that have been prescribed to you. Take any new Medicines after you have completely understood and accpet all the possible adverse reactions/side effects.   Increase activity slowly   Complete by:  As directed       Discharge Medications   Allergies as of 08/17/2018   No Known Allergies     Medication List    STOP taking these medications   amLODipine 10 MG tablet Commonly known as:  NORVASC   clopidogrel 75 MG tablet Commonly known as:  PLAVIX   glimepiride 2 MG tablet Commonly known as:  AMARYL   losartan 100 MG tablet Commonly known as:  COZAAR   minoxidil 2.5 MG tablet Commonly known as:  LONITEN     TAKE these medications   aspirin 325 MG EC tablet Take 1 tablet (325 mg total) by mouth daily. What changed:    medication strength  how much to take  when to take this   atorvastatin 80 MG tablet Commonly known as:   LIPITOR Take by mouth.   carvedilol 6.25 MG tablet Commonly known as:  COREG Take 1 tablet (6.25 mg total) by mouth 2 (two) times daily with a meal. What changed:    medication strength  how much to take  when to take this   ezetimibe 10 MG tablet Commonly known as:  ZETIA   gabapentin 600 MG tablet Commonly known as:  NEURONTIN Take by mouth. Notes to patient:  Tonight at 10 pm   insulin aspart 100 UNIT/ML injection Commonly known as:  novoLOG Substitute to any brand approved. . Before each meal 3 times a day, 140-199 - 2 units, 200-250 - 4 units, 251-299 - 6 units,  300-349 - 8 units,  350 or above 10 units. Dispense syringes and needles as needed, Ok to switch to PEN if approved. DX DM2, Code E11.65   Insulin Syringe-Needle U-100 25G X 1" 1 ML Misc For 4 times a day insulin SQ, 1 month supply. Diagnosis E11.65   niacin 100 MG tablet Take 1 tablet (100 mg total) by mouth 3 (three) times daily with meals.   pantoprazole 40 MG tablet Commonly known as:  PROTONIX Take 1 tablet (40 mg total) by mouth daily.   ticagrelor 60 MG Tabs tablet Commonly known as:  BRILINTA Take 1.5 tablets (90 mg total) by mouth 2 (two) times daily.   TOUJEO MAX SOLOSTAR 300 UNIT/ML Sopn Generic drug:  Insulin Glargine (2 Unit Dial) Inject into the skin.   traMADol 50 MG tablet Commonly known as:  ULTRAM Take by mouth.            Durable Medical Equipment  (From admission, onward)         Start     Ordered   08/17/18 0845  For home use only DME Walker rolling  Once    Comments:  5 wheel  Question:  Patient needs a walker to treat with the following condition  Answer:  CVA (cerebral vascular accident) [facility])   08/17/18 0844          Follow-up Information    Plunk, Charlsie Quest, FNP. Schedule an appointment as soon as possible for a visit in 1 week(s).   Specialty:  Nurse Practitioner Contact information: [address] [place] [address] [phone]         Micki Riley, MD. Schedule an appointment as soon as possible for a visit in 1 week(s).   Specialties:  Neurology, Radiology Contact information: [address] [place] [address] [phone]        Julieanne Cotton, MD. Schedule an appointment as soon as possible for a visit in 2 day(s).   Specialties:  Interventional Radiology, Radiology Contact information: [address] [place] [address] [phone]        [facility] Follow up.   Why:  home healt services arranged Contact information: FAX: [phone]  P- [phone]           Major procedures and Radiology Reports - PLEASE review detailed and final reports thoroughly  -      MRI - 1. Mild motion degraded examination. Acute to subacute LEFT parietal/MCA territory infarct. 2. 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 3. Mild-to-moderate chronic small vessel ischemic changes  TTE  - Left ventricle: The cavity size was normal. Wall thickness was normal. Systolic function was vigorous. The estimated ejection fraction was in the range of 65% to 70%. Wall motion was normal; there were no regional wall motion abnormalities. Doppler parameters are consistent with abnormal left ventricular relaxation (grade 1 diastolic dysfunction).  Impressions:  No cardiac source of emboli was indentified.   Angiogram  S/P 4 vessel cerebral arteriogram RT CFA approach. Findings. 1.Severe irregular seg stenosis of RT MCA M1 seg. 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 3.Severe  90 5 + stenosis of Lt VA origin 4.Severe LT PCA P1 stenosis. 5.Mod RT PCA P1 stenosis. 6.Approx 40 % LT ICA cavenous seg stenosis    Ct Angio Head W Or Wo Contrast  Result Date: 08/13/2018 CLINICAL DATA:  Arterial stricture/occlusion of the head and neck. Weakness beginning 2 weeks ago. Recent admission for stroke. Episodes of shaking and absence staring. EXAM: CT ANGIOGRAPHY HEAD AND  NECK CT PERFUSION BRAIN TECHNIQUE: Multidetector CT imaging of the head and neck was performed using the standard protocol during bolus administration of intravenous contrast. Multiplanar CT image reconstructions and MIPs were obtained to evaluate the vascular anatomy. Carotid stenosis measurements (when applicable) are obtained utilizing NASCET criteria, using the distal internal carotid diameter as the denominator. Multiphase CT imaging of the brain was performed following IV bolus contrast injection. Subsequent parametric perfusion maps were calculated using RAPID software. CONTRAST:  ISOVUE-370 IOPAMIDOL (ISOVUE-370) INJECTION 76% COMPARISON:  MRI brain 08/13/2018 at 4:37 p.m. FINDINGS: CT HEAD FINDINGS Brain: Remote left ACA territory infarct is noted. Additional areas of remote left MCA territory infarcts are present. The areas of more acute infarct are less well delineated on noncontrast CT of the head. The right hemisphere is unremarkable. The brainstem and cerebellum are normal. Vascular: No hyperdense vessel or unexpected calcification. Skull: Calvarium is intact. No focal lytic or blastic lesions are present. Sinuses/Orbits: The paranasal sinuses mastoid air cells are clear. Globes and orbits are within normal limits. ASPECTS (Alberta Stroke Program Early CT Score) - Ganglionic level infarction (caudate, lentiform nuclei, internal capsule, insula, M1-M3 cortex): 7/7 - Supraganglionic infarction (M4-M6 cortex): 3/3 Total score (0-10 with 10 being normal): 10/10 Review of the MIP images confirms the above findings CTA NECK FINDINGS Aortic arch: A three-vessel arch configuration is present. There is no significant calcification at the aortic arch or stenosis. Right carotid system: Right common carotid artery is within normal limits. Right ICA bifurcation scratched at the right carotid bifurcation is normal. There is tortuosity of the distal right ICA without significant stenosis. Left carotid system:  The left common carotid artery is within normal limits. Bifurcation is unremarkable. There is moderate tortuosity of the distal cervical ICA without significant stenosis. Vertebral arteries: The vertebral arteries originate from the subclavian arteries bilaterally. There is a high-grade stenosis of the left vertebral artery at its origin. The right vertebral artery is the dominant vessel. No other focal stenosis or vascular injury is present. Skeleton: Vertebral body heights alignment are maintained. 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 No focal lytic or blastic lesions are present. Hyperostosis is noted. Other neck: Subcentimeter nodules are present within the right lobe of the thyroid. No dominant lesion is present. No follow-up is necessary. No significant cervical adenopathy is present. Salivary glands are within normal limits. No focal mucosal lesions are present. Upper chest: There is dependent atelectasis at the lung apices. Mild interstitial coarsening suggests mild edema. Review of the MIP images confirms the above findings CTA HEAD FINDINGS Anterior circulation: Atherosclerotic changes are present within the cavernous internal carotid arteries bilaterally. The lumen of the right ICA is narrowed to 1.5 mm. This compares to more distal vessel of 3 mm. There is a high-grade stenosis of the paraclinoid left ICA. 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 This compares with 2.6 mm at the bifurcation. More mild atherosclerotic changes are present in the right M1 segment. Right MCA bifurcation is intact. Left MCA bifurcation is intact.  There is some attenuation of distal MCA branch vessels. Chronic left ACA occlusion is noted. Posterior circulation: The right vertebral artery is the dominant vessel. PICA origins are visualized and normal. Vertebrobasilar junction is normal. Basilar artery is within normal limits. Both posterior  cerebral arteries originate from the basilar tip. There is moderate narrowing of the proximal P2 segments bilaterally. Distal PCA branch vessels are intact with distal irregularity. Venous sinuses: Dural sinuses are patent. Straight sinus deep cerebral veins are intact. Cortical veins are within normal limits. Anatomic variants: None Delayed phase: Postcontrast images demonstrate no pathologic enhancement. 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 Review of the MIP images confirms the above findings CT Brain Perfusion Findings: CBF (<30%) Volume: 6mL Perfusion (Tmax>6.0s) volume: 87mL Mismatch Volume: 81mL Infarction Location:Left MCA territory IMPRESSION: 1. 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 2. The area of reduced cerebral blood flow on the CT perfusion corresponds to a remote infarct and is likely artifactual. The area of acute ischemia on the MRI is not demonstrated on the CT perfusion exam. 3. Large area of ischemic penumbra involving the residual left MCA territory, likely secondary to the high-grade stenoses proximally. 4. 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 5. Tortuosity of the cervical internal carotid arteries bilaterally without significant stenosis. 6. 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 Electronically Signed   By: Marin Roberts M.D.   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 Weakness beginning 2 weeks ago. Recent admission for stroke. Episodes of shaking and absence staring. EXAM: CT ANGIOGRAPHY HEAD AND NECK CT PERFUSION BRAIN TECHNIQUE: Multidetector CT imaging of the head and neck was performed using the standard protocol during bolus administration of intravenous contrast. Multiplanar CT image reconstructions and MIPs  were obtained to evaluate the vascular anatomy. Carotid stenosis measurements (when applicable) are obtained utilizing NASCET criteria, using the distal internal carotid diameter as the denominator. Multiphase CT imaging of the brain was performed following IV bolus contrast injection. Subsequent parametric perfusion maps were calculated using RAPID software. CONTRAST:  ISOVUE-370 IOPAMIDOL (ISOVUE-370) INJECTION 76% COMPARISON:  MRI brain 08/13/2018 at 4:37 p.m. FINDINGS: CT HEAD FINDINGS Brain: Remote left ACA territory infarct is noted. Additional areas of remote left MCA territory infarcts are present. The areas of more acute infarct are less well delineated on noncontrast CT of the head. The right hemisphere is unremarkable. The brainstem and cerebellum are normal. Vascular: No hyperdense vessel or unexpected calcification. Skull: Calvarium is intact. No focal lytic or blastic lesions are present. Sinuses/Orbits: The paranasal sinuses mastoid air cells are clear. Globes and orbits are within normal limits. ASPECTS (Alberta Stroke Program Early CT Score) - Ganglionic level infarction (caudate, lentiform nuclei, internal capsule, insula, M1-M3 cortex): 7/7 - Supraganglionic infarction (M4-M6 cortex): 3/3 Total score (0-10 with 10 being normal): 10/10 Review of the MIP images confirms the above findings CTA NECK FINDINGS Aortic arch: A three-vessel arch configuration is present. There is no significant calcification at the aortic arch or stenosis. Right carotid system: Right common carotid artery is within normal limits. Right ICA bifurcation scratched at the right carotid bifurcation is normal. There is tortuosity of the distal right ICA without significant stenosis. Left carotid system: The left common carotid artery is within normal limits. Bifurcation is unremarkable. There is  moderate tortuosity of the distal cervical ICA without significant stenosis. Vertebral arteries: The vertebral arteries originate  from the subclavian arteries bilaterally. There is a high-grade stenosis of the left vertebral artery at its origin. The right vertebral artery is the dominant vessel. No other focal stenosis or vascular injury is present. Skeleton: Vertebral body heights alignment are maintained. 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 No focal lytic or blastic lesions are present. Hyperostosis is noted. Other neck: Subcentimeter nodules are present within the right lobe of the thyroid. No dominant lesion is present. No follow-up is necessary. No significant cervical adenopathy is present. Salivary glands are within normal limits. No focal mucosal lesions are present. Upper chest: There is dependent atelectasis at the lung apices. Mild interstitial coarsening suggests mild edema. Review of the MIP images confirms the above findings CTA HEAD FINDINGS Anterior circulation: Atherosclerotic changes are present within the cavernous internal carotid arteries bilaterally. The lumen of the right ICA is narrowed to 1.5 mm. This compares to more distal vessel of 3 mm. There is a high-grade stenosis of the paraclinoid left ICA. 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 This compares with 2.6 mm at the bifurcation. More mild atherosclerotic changes are present in the right M1 segment. Right MCA bifurcation is intact. Left MCA bifurcation is intact. There is some attenuation of distal MCA branch vessels. Chronic left ACA occlusion is noted. Posterior circulation: The right vertebral artery is the dominant vessel. PICA origins are visualized and normal. Vertebrobasilar junction is normal. Basilar artery is within normal limits. Both posterior cerebral arteries originate from the basilar tip. There is moderate narrowing of the proximal P2 segments bilaterally. Distal PCA branch vessels are intact with distal irregularity. Venous sinuses: Dural sinuses are patent.  Straight sinus deep cerebral veins are intact. Cortical veins are within normal limits. Anatomic variants: None Delayed phase: Postcontrast images demonstrate no pathologic enhancement. 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 Review of the MIP images confirms the above findings CT Brain Perfusion Findings: CBF (<30%) Volume: 6mL Perfusion (Tmax>6.0s) volume: 87mL Mismatch Volume: 81mL Infarction Location:Left MCA territory IMPRESSION: 1. 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 2. The area of reduced cerebral blood flow on the CT perfusion corresponds to a remote infarct and is likely artifactual. The area of acute ischemia on the MRI is not demonstrated on the CT perfusion exam. 3. Large area of ischemic penumbra involving the residual left MCA territory, likely secondary to the high-grade stenoses proximally. 4. 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 5. Tortuosity of the cervical internal carotid arteries bilaterally without significant stenosis. 6. 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 Electronically Signed   By: Marin Roberts M.D.   On: 08/13/2018 19:29   Mr Brain Wo Contrast (neuro Protocol)  Result Date: 08/13/2018 CLINICAL DATA:  Seizure like episodes for 2 weeks. History of stroke, hypertension and diabetes. EXAM: MRI HEAD WITHOUT CONTRAST TECHNIQUE: Multiplanar, multiecho pulse sequences of the brain and surrounding structures were obtained without intravenous contrast. COMPARISON:  None. FINDINGS: Mild motion degraded examination. INTRACRANIAL CONTENTS: Patchy LEFT parietal reduced diffusion with nearly normalized ADC values and bright FLAIR signal. Spurious reduced diffusion mesial LEFT frontoparietal lobes associated with confluent encephalomalacia and susceptibility artifact. Ex vacuo dilatation LEFT lateral ventricle. Patchy LEFT  parietooccipital lobe encephalomalacia. Smaller T2 bright LEFT cerebral  peduncle and patchy LEFT pontine T2 hyperintense signal consistent with wallerian degeneration. Patchy supratentorial white matter FLAIR T2 hyperintensities. No susceptibility artifact to suggest hemorrhage. The ventricles and sulci are normal for patient's age. No suspicious parenchymal signal, masses, mass effect. No abnormal extra-axial fluid collections. No extra-axial masses. VASCULAR: Normal major intracranial vascular flow voids present at skull base. SKULL AND UPPER CERVICAL SPINE: No abnormal sellar expansion. No suspicious calvarial bone marrow signal. Craniocervical junction maintained. SINUSES/ORBITS: The mastoid air-cells and included paranasal sinuses are well-aerated.The included ocular globes and orbital contents are non-suspicious. OTHER: None. IMPRESSION: 1. Mild motion degraded examination. Acute to subacute LEFT parietal/MCA territory infarct. 2. 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 3. Mild-to-moderate chronic small vessel ischemic changes. Electronically Signed   By: Awilda Metroourtnay  Bloomer M.D.   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 Weakness beginning 2 weeks ago. Recent admission for stroke. Episodes of shaking and absence staring. EXAM: CT ANGIOGRAPHY HEAD AND NECK CT PERFUSION BRAIN TECHNIQUE: Multidetector CT imaging of the head and neck was performed using the standard protocol during bolus administration of intravenous contrast. Multiplanar CT image reconstructions and MIPs were obtained to evaluate the vascular anatomy. Carotid stenosis measurements (when applicable) are obtained utilizing NASCET criteria, using the distal internal carotid diameter as the denominator. Multiphase CT imaging of the brain was performed following IV bolus contrast injection. Subsequent parametric  perfusion maps were calculated using RAPID software. CONTRAST:  115mL ISOVUE-370 IOPAMIDOL (ISOVUE-370) INJECTION 76% COMPARISON:  MRI brain 08/13/2018 at 4:37 p.m. FINDINGS: CT HEAD FINDINGS Brain: Remote left ACA territory infarct is noted. Additional areas of remote left MCA territory infarcts are present. The areas of more acute infarct are less well delineated on noncontrast CT of the head. The right hemisphere is unremarkable. The brainstem and cerebellum are normal. Vascular: No hyperdense vessel or unexpected calcification. Skull: Calvarium is intact. No focal lytic or blastic lesions are present. Sinuses/Orbits: The paranasal sinuses mastoid air cells are clear. Globes and orbits are within normal limits. ASPECTS (Alberta Stroke Program Early CT Score) - Ganglionic level infarction (caudate, lentiform nuclei, internal capsule, insula, M1-M3 cortex): 7/7 - Supraganglionic infarction (M4-M6 cortex): 3/3 Total score (0-10 with 10 being normal): 10/10 Review of the MIP images confirms the above findings CTA NECK FINDINGS Aortic arch: A three-vessel arch configuration is present. There is no significant calcification at the aortic arch or stenosis. Right carotid system: Right common carotid artery is within normal limits. Right ICA bifurcation scratched at the right carotid bifurcation is normal. There is tortuosity of the distal right ICA without significant stenosis. Left carotid system: The left common carotid artery is within normal limits. Bifurcation is unremarkable. There is moderate tortuosity of the distal cervical ICA without significant stenosis. Vertebral arteries: The vertebral arteries originate from the subclavian arteries bilaterally. There is a high-grade stenosis of the left vertebral artery at its origin. The right vertebral artery is the dominant vessel. No other focal stenosis or vascular injury is present. Skeleton: Vertebral body heights alignment are maintained. 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 No focal lytic or blastic lesions are present. Hyperostosis is noted. Other neck: Subcentimeter nodules are present within the right lobe of the thyroid. No dominant lesion is present. No follow-up is necessary. No significant cervical adenopathy is present. Salivary glands are within normal limits. No focal mucosal lesions are present.  Upper chest: There is dependent atelectasis at the lung apices. Mild interstitial coarsening suggests mild edema. Review of the MIP images confirms the above findings CTA HEAD FINDINGS Anterior circulation: Atherosclerotic changes are present within the cavernous internal carotid arteries bilaterally. The lumen of the right ICA is narrowed to 1.5 mm. This compares to more distal vessel of 3 mm. There is a high-grade stenosis of the paraclinoid left ICA. 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 This compares with 2.6 mm at the bifurcation. More mild atherosclerotic changes are present in the right M1 segment. Right MCA bifurcation is intact. Left MCA bifurcation is intact. There is some attenuation of distal MCA branch vessels. Chronic left ACA occlusion is noted. Posterior circulation: The right vertebral artery is the dominant vessel. PICA origins are visualized and normal. Vertebrobasilar junction is normal. Basilar artery is within normal limits. Both posterior cerebral arteries originate from the basilar tip. There is moderate narrowing of the proximal P2 segments bilaterally. Distal PCA branch vessels are intact with distal irregularity. Venous sinuses: Dural sinuses are patent. Straight sinus deep cerebral veins are intact. Cortical veins are within normal limits. Anatomic variants: None Delayed phase: Postcontrast images demonstrate no pathologic enhancement. 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 Review of the MIP images confirms the above findings  CT Brain Perfusion Findings: CBF (<30%) Volume: 6mL Perfusion (Tmax>6.0s) volume: 87mL Mismatch Volume: 81mL Infarction Location:Left MCA territory IMPRESSION: 1. 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 2. The area of reduced cerebral blood flow on the CT perfusion corresponds to a remote infarct and is likely artifactual. The area of acute ischemia on the MRI is not demonstrated on the CT perfusion exam. 3. Large area of ischemic penumbra involving the residual left MCA territory, likely secondary to the high-grade stenoses proximally. 4. 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 5. Tortuosity of the cervical internal carotid arteries bilaterally without significant stenosis. 6. 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 Electronically Signed   By: Marin Roberts M.D.   On: 08/13/2018 19:29    Micro Results    No results found for this or any previous visit (from the past 240 hour(s)).  Today   Subjective    Julia Ferguson today has no headache,no chest abdominal pain,no new weakness tingling or numbness, feels much better wants to go home today.     Objective   Blood pressure (!) 149/84, pulse 79, temperature 97.8 F (36.6 C), temperature source Oral, resp. rate 16, height 5\' 2"  (1.575 m), weight 85.6 kg, SpO2 100 %.   Intake/Output Summary (Last 24 hours) at 08/17/2018 1110 Last data filed at 08/17/2018 0900 Gross per 24 hour  Intake 1621.35 ml  Output 200 ml  Net 1421.35 ml    Exam Awake Alert, Oriented x 3, No new F.N deficits, Normal affect [place].AT,PERRAL Supple Neck,No JVD, No cervical lymphadenopathy appriciated.  Symmetrical Chest wall movement, Good air movement bilaterally, CTAB RRR,No Gallops,Rubs or new Murmurs, No Parasternal Heave +ve B.Sounds, Abd Soft, Non tender, No organomegaly appriciated, No rebound -guarding or rigidity. No Cyanosis,  Clubbing or edema, No new Rash or bruise   Data Review   CBC w Diff:  Lab Results  Component Value Date   WBC 7.4 08/16/2018   HGB 10.3 (L) 08/16/2018   HCT 33.5 (L) 08/16/2018   PLT 351 08/16/2018  LYMPHOPCT 25 08/13/2018   MONOPCT 8 08/13/2018   EOSPCT 3 08/13/2018   BASOPCT 1 08/13/2018    CMP:  Lab Results  Component Value Date   NA 136 08/16/2018   K 3.6 08/16/2018   CL 106 08/16/2018   CO2 21 (L) 08/16/2018   BUN 14 08/16/2018   CREATININE 1.22 (H) 08/16/2018   PROT 9.0 (H) 08/13/2018   ALBUMIN 4.4 08/13/2018   BILITOT 1.0 08/13/2018   ALKPHOS 63 08/13/2018   AST 16 08/13/2018   ALT 19 08/13/2018  .   Total Time in preparing paper work, data evaluation and todays exam - 35 minutes  Susa Raring M.D on 08/17/2018 at 11:10 AM  [facility]   Office  [phone]

## 2018-08-17 NOTE — Progress Notes (Signed)
Julia Ferguson discharged Home with family and home health services per MD order.  Discharge instructions reviewed and discussed with the patient's mother, Malachi BondsGloria, all questions and concerns answered. Copy of instructions, care notes for new medications/daignosis and scripts given to patient.  Allergies as of 08/17/2018   No Known Allergies     Medication List    STOP taking these medications   amLODipine 10 MG tablet Commonly known as:  NORVASC   glimepiride 2 MG tablet Commonly known as:  AMARYL   losartan 100 MG tablet Commonly known as:  COZAAR   minoxidil 2.5 MG tablet Commonly known as:  LONITEN     TAKE these medications   aspirin 325 MG EC tablet Take 1 tablet (325 mg total) by mouth daily. What changed:    medication strength  how much to take  when to take this   atorvastatin 80 MG tablet Commonly known as:  LIPITOR Take by mouth.   carvedilol 6.25 MG tablet Commonly known as:  COREG Take 1 tablet (6.25 mg total) by mouth 2 (two) times daily with a meal. What changed:    medication strength  how much to take  when to take this   clopidogrel 75 MG tablet Commonly known as:  PLAVIX Take by mouth.   ezetimibe 10 MG tablet Commonly known as:  ZETIA   gabapentin 600 MG tablet Commonly known as:  NEURONTIN Take by mouth. Notes to patient:  Tonight at 10 pm   insulin aspart 100 UNIT/ML injection Commonly known as:  novoLOG Substitute to any brand approved. . Before each meal 3 times a day, 140-199 - 2 units, 200-250 - 4 units, 251-299 - 6 units,  300-349 - 8 units,  350 or above 10 units. Dispense syringes and needles as needed, Ok to switch to PEN if approved. DX DM2, Code E11.65   Insulin Syringe-Needle U-100 25G X 1" 1 ML Misc For 4 times a day insulin SQ, 1 month supply. Diagnosis E11.65   niacin 100 MG tablet Take 1 tablet (100 mg total) by mouth 3 (three) times daily with meals.   pantoprazole 40 MG tablet Commonly known as:   PROTONIX Take 1 tablet (40 mg total) by mouth daily.   TOUJEO MAX SOLOSTAR 300 UNIT/ML Sopn Generic drug:  Insulin Glargine (2 Unit Dial) Inject into the skin.   traMADol 50 MG tablet Commonly known as:  ULTRAM Take by mouth.            Durable Medical Equipment  (From admission, onward)         Start     Ordered   08/17/18 0845  For home use only DME Walker rolling  Once    Comments:  5 wheel  Question:  Patient needs a walker to treat with the following condition  Answer:  CVA (cerebral vascular accident) (HCC)   08/17/18 0844         IV site will be discontinued right before patient is ready to discharge, family is on the way [place]  Patient will be escorted to car by NT/volunteer in a wheelchair,  no distress noted upon discharge.  Laural BenesJohnson, Christipher Rieger C 08/17/2018 10:52 AM

## 2018-08-17 NOTE — Progress Notes (Signed)
Physical Therapy Treatment Patient Details Name: Julia Ferguson MRN: [id number] DOB: 15-Mar-1971 Today's Date: 08/17/2018    History of Present Illness 47yo female brought to the ED due to spasm/jerking R UE, twitching and turning face to L, staring of 1 week duration, slurred speech and R sided weakness. No LOC. 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 No tpa given. PMH DM, HTN, CVA with R residual deficits     PT Comments    Patient's tolerance to treatment today was good.  Patient was in bed with nursing and mother present upon PT arrival.  Patient's BP was measured twice sitting EOB (163/87 and 160/85) with patient stating "so-so" when asked about symptoms of lightheadedness or dizziness.  Patient was able to stand to RW from elevated bed surface requiring min A and VC for hand placement while therapist provided pericare. Patient required increased time for processing throughout session.  Patient completed seated LE exercises and required VC for proper technique.  I have discussed the patient's current level of function related to strength and mobility deficits with the patient and caregiver.  They acknowledge understanding of this and feel they can provide the level of care the patient will need at home. Patient continues to be a good candidate for HHPT based on current functional status.        Follow Up Recommendations  Home health PT;Supervision/Assistance - 24 hour     Equipment Recommendations  None recommended by PT    Recommendations for Other Services       Precautions / Restrictions Precautions Precautions: Fall Precaution Comments: R residual deficits from previous CVA, foot drop, severe neuropathy B feet  Restrictions Weight Bearing Restrictions: No    Mobility  Bed Mobility Overal bed mobility: Needs Assistance Bed Mobility: Supine to  Sit     Supine to sit: Min assist     General bed mobility comments: Pt required min assist and VC to move B LE over EOB and to square up hips in sitting.  Pt required min assist for support and elevation of trunk from supine to sitting with HOB elevated.  Transfers Overall transfer level: Needs assistance Equipment used: Rolling walker (2 wheeled) Transfers: Sit to/from [employer] Sit to Stand: Min assist;From elevated surface Stand pivot transfers: Min assist       General transfer comment: Patient required min A for scooting to EOB. Patient required VC for hand placement on bed.  Patient attempted first sit to stand from EOB and was unable to complete requiring elevation of bed for assistance.  Pt required increased time to boost into standing and elevate trunk.  Pt required VC for hand placement on walker while standing during pericare.   Pt performed standing pivot transfer to chair requiring VC and min A for backing to chair and reaching for armrests.  Patient required VC for guiding RW during transfer.  Ambulation/Gait                 Stairs             Wheelchair Mobility    Modified Rankin (Stroke Patients Only) Modified Rankin (Stroke Patients Only) Pre-Morbid Rankin Score: Moderately severe disability Modified Rankin: Moderately severe disability     Balance Overall balance assessment: Needs assistance Sitting-balance support: Feet supported;No upper extremity supported Sitting balance-Leahy Scale: Good     Standing balance support:  Bilateral upper extremity supported;During functional activity Standing balance-Leahy Scale: Poor Standing balance comment: reliance on B UE support                            Cognition Arousal/Alertness: Awake/alert Behavior During Therapy: WFL for tasks assessed/performed Overall Cognitive Status: History of cognitive impairments - at baseline                                  General Comments: Pt continues to require increased time for processing.  Continues to answer with "so-so" to most questions asked.      Exercises General Exercises - Lower Extremity Long Arc Quad: AROM;Both;Seated(2x10)    General Comments        Pertinent Vitals/Pain Pain Assessment: No/denies pain    Home Living                      Prior Function            PT Goals (current goals can now be found in the care plan section) Acute Rehab PT Goals Patient Stated Goal: none stated PT Goal Formulation: With patient/family Time For Goal Achievement: 08/28/18 Potential to Achieve Goals: Good Progress towards PT goals: Progressing toward goals    Frequency    Min 3X/week      PT Plan Current plan remains appropriate    Co-evaluation              AM-PAC PT "6 Clicks" Mobility   Outcome Measure  Help needed turning from your back to your side while in a flat bed without using bedrails?: A Little Help needed moving from lying on your back to sitting on the side of a flat bed without using bedrails?: A Little Help needed moving to and from a bed to a chair (including a wheelchair)?: A Little Help needed standing up from a chair using your arms (e.g., wheelchair or bedside chair)?: A Little Help needed to walk in hospital room?: A Little Help needed climbing 3-5 steps with a railing? : A Lot 6 Click Score: 17    End of Session Equipment Utilized During Treatment: Gait belt Activity Tolerance: Patient tolerated treatment well Patient left: in chair;with call bell/phone within reach;with chair alarm set;with nursing/sitter in room;with family/visitor present Nurse Communication: Mobility status PT Visit Diagnosis: Unsteadiness on feet (R26.81);Muscle weakness (generalized) (M62.81);Other abnormalities of gait and mobility (R26.89)     Time: [id number] PT Time Calculation (min) (ACUTE ONLY): 40 min  Charges:  $Therapeutic Activity: 38-52 mins                      [address]Niala Stcharles, SPTA    Annabeth Tortora 08/17/2018, 1:26 PM

## 2018-08-17 NOTE — Discharge Instructions (Signed)
Follow with Primary MD Plunk, Charlsie Questiffany Quinn, FNP in 7 days   Get CBC, CMP, 2 view Chest X ray -  checked  by Primary MD  in 5-7 days   Activity: As tolerated with Full fall precautions use walker/cane & assistance as needed  Disposition Home    Diet: Heart Healthy    Special Instructions: If you have smoked or chewed Tobacco  in the last 2 yrs please stop smoking, stop any regular Alcohol  and or any Recreational drug use.  On your next visit with your primary care physician please Get Medicines reviewed and adjusted.  Please request your Prim.MD to go over all Hospital Tests and Procedure/Radiological results at the follow up, please get all Hospital records sent to your Prim MD by signing hospital release before you go home.  If you experience worsening of your admission symptoms, develop shortness of breath, life threatening emergency, suicidal or homicidal thoughts you must seek medical attention immediately by calling 911 or calling your MD immediately  if symptoms less severe.  You Must read complete instructions/literature along with all the possible adverse reactions/side effects for all the Medicines you take and that have been prescribed to you. Take any new Medicines after you have completely understood and accpet all the possible adverse reactions/side effects.

## 2018-08-17 NOTE — Progress Notes (Signed)
STROKE TEAM PROGRESS NOTE   SUBJECTIVE (INTERVAL HISTORY) Her mother is at bedside.  She is sitting in chair, no complaints.  She is ready to go home  Planning for left MCA stenting next week per Dr Corliss Skains.  OBJECTIVE Vitals:   08/16/18 0429 08/16/18 1321 08/16/18 2039 08/17/18 0512  BP: 139/75 (!) 157/83 140/83 (!) 149/84  Pulse: 77 78 79 79  Resp: 16 18 20 16   Temp: 98 F (36.7 C) 98 F (36.7 C) 98.2 F (36.8 C) 97.8 F (36.6 C)  TempSrc: Oral Oral Oral Oral  SpO2: 98% 100% 100% 100%  Weight:      Height:        CBC:  Recent Labs  Lab 08/13/18 1722  08/15/18 0453 08/16/18 0421  WBC 7.7  --  7.7 7.4  NEUTROABS 4.9  --   --   --   HGB 11.4*   < > 10.7* 10.3*  HCT 36.0   < > 33.4* 33.5*  MCV 85.7  --  84.6 85.0  PLT 386  --  352 351   < > = values in this interval not displayed.    Basic Metabolic Panel:  Recent Labs  Lab 08/15/18 0453 08/16/18 0421  NA 137 136  K 3.4* 3.6  CL 103 106  CO2 24 21*  GLUCOSE 131* 123*  BUN 13 14  CREATININE 1.41* 1.22*  CALCIUM 9.3 9.0  MG  --  2.2    Lipid Panel:     Component Value Date/Time   CHOL 138 08/15/2018 0453   TRIG 63 08/15/2018 0453   HDL 42 08/15/2018 0453   CHOLHDL 3.3 08/15/2018 0453   VLDL 13 08/15/2018 0453   LDLCALC 83 08/15/2018 0453   HgbA1c:  Lab Results  Component Value Date   HGBA1C 6.9 (H) 08/15/2018   Urine Drug Screen:     Component Value Date/Time   LABOPIA NONE DETECTED 08/14/2018 0829   COCAINSCRNUR NONE DETECTED 08/14/2018 0829   LABBENZ NONE DETECTED 08/14/2018 0829   AMPHETMU NONE DETECTED 08/14/2018 0829   THCU NONE DETECTED 08/14/2018 0829   LABBARB NONE DETECTED 08/14/2018 0829    Alcohol Level     Component Value Date/Time   ETH <10 08/13/2018 1722    IMAGING  Ct Angio Head W Or Wo Contrast Ct Angio Neck W Or Wo Contrast Ct Cerebral Perfusion W Contrast 08/13/2018 IMPRESSION:  1. High-grade stenosis of the supraclinoid left internal carotid artery and the  left M1 segment.  2. The area of reduced cerebral blood flow on the CT perfusion corresponds to a remote infarct and is likely artifactual. The area of acute ischemia on the MRI is not demonstrated on the CT perfusion exam.  3. Large area of ischemic penumbra involving the residual left MCA territory, likely secondary to the high-grade stenoses proximally.  4. 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  5. Tortuosity of the cervical internal carotid arteries bilaterally without significant stenosis.  6. 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    Mr Brain Wo Contrast (neuro Protocol) 08/13/2018 IMPRESSION:  1. Mild motion degraded examination. Acute to subacute LEFT parietal/MCA territory infarct.  2. 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  3. Mild-to-moderate chronic small vessel ischemic changes.     Transthoracic Echocardiogram  08/14/2018 Study Conclusions - Left ventricle: The cavity size was normal. Wall thickness was   normal. Systolic function was vigorous. The estimated ejection  fraction was in the range of 65% to 70%. Wall motion was normal;   there were no regional wall motion abnormalities. Doppler   parameters are consistent with abnormal left ventricular   relaxation (grade 1 diastolic dysfunction). Impressions: - No cardiac source of emboli was indentified.  EEG This is an abnormal electroencephalogram secondary to left hemispheric intermittent slowing which is consistent with the patient's history of left hemispheric infarcts.    DSA 1.Severe irregular seg stenosis of LT MCA M1 seg. 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 3.Severe 90 5 + stenosis of Lt VA origin 4.Severe LT PCA P1 stenosis. 5.Mod RT PCA P1 stenosis. 6.Approx 40 % LT ICA cavenous seg stenosis   PHYSICAL EXAM  Temp:  [97.8 F (36.6 C)-98.2 F (36.8 C)] 97.8 F (36.6 C) (11/25 0512) Pulse Rate:   [79] 79 (11/25 0512) Resp:  [16-20] 16 (11/25 0512) BP: (140-149)/(83-84) 149/84 (11/25 0512) SpO2:  [100 %] 100 % (11/25 0512)  General - Well nourished, well developed middle aged african american lady, in no apparent distress.  Ophthalmologic - fundi not visualized due to noncooperation.  Cardiovascular - Regular rate and rhythm.  Neuro - awake, alert, sitting in chair, moderate expressive aphasia  Able to follow simple commands, able to name 3 out of 3, able to repeat, paucity of speech, mild to moderate dysarthria.  Visual field full.  PERRL, EOMI.  Mild right facial droop, tongue midline.  Sensation symmetrical bilateral face.  Left upper and lower extremity 5/5 muscle strength, right upper extremity 3+/5 proximal, 3/5 distally.  Right lower extremity 5-/5 proximal and 3+/5 foot DF.  Sensation symmetrical.  Bilateral finger-to-nose no ataxia but slow in action.  Gait not tested.   ASSESSMENT/PLAN Julia Ferguson is a 47 y.o. female with history of previous stroke, Htn, and DM presenting with increased right sided weakness. She did not receive IV t-PA due to late presentation.  Recurrent strokes: Lt MCA small acute on large chronic infarct - likely due to left cavernous ICA and MCA high-grade stenosis.    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 Multiple old infarcts involving left ACA and left anterior and posterior frontal MCA.  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   Cerebral angiogram showed severe left M1 stenosis, bilateral P1 stenosis, left AV CEA occluded, left ICA cavernous 40% stenosis.  Plan for elective left M1 stenting  Next week with Dr. Corliss Skains  2D Echo  - EF 65 - 70%. No  cardiac source of emboli identified.   LDL - 83  HgbA1c 6.9  Hypercoagulable and autoimmune work-up so far negative.   UDS - negative  EEG - left hemispheric intermittent slowing, no seizure  VTE prophylaxis - SCDs  Diet - Heart healthy / carb modified with thin liquids.  aspirin 81 mg daily and clopidogrel 75 mg daily for more than a year prior to admission, now on aspirin 325 mg daily and clopidogrel 75 mg daily given severe intracranial stenosis.  Continue on discharge  Patient counseled to be compliant with her antithrombotic medications  Ongoing aggressive stroke risk factor management  Therapy recommendations:  pending  Disposition:  Pending  Complex partial seizure  For the past week, patient had right arm shaking  jerking with tonic flexion lasting 15 to 30 seconds, followed by Todd's paralysis for 30 minutes also associate with staring spells.  Had Keppra load and now on Keppra 500 twice daily  EEG no seizure but left hemispheric intermittent slowing  Continue Keppra for seizure control on discharge  No recurrent seizure like episodes so far  History of stroke  2017 while she was working and did not feel good.  She was admitted for 3 to 4 days for headache and high BP and was told to have a stroke due to high BP and diabetes not in good control.  No deficit residual.    Second stroke in 2018 with right-sided weakness and speech difficulty, admitted to hospital for 5 days.  Was told due to blood pressure and diabetes and a stressful job.  Discharged on aspirin and Plavix which was continued until now.    one-week ago [facility] with worsening right-sided weakness and speech difficulty, [facility] showed new MCA stroke and was discharged with outpatient neurology follow-up which was not able to set up due to appointment will be in January.    ?? Need to rule out CNS vasculitis   less  likely given large vessel involvement instead of medium size or small size vessels  No typical presentation with headache and intermittent cognitive impairment  DSA suggested atherosclerosis disease instead of CNS vasculitis  Not a candidate for LP at this time given long-term Plavix use.   We will set outpatient follow-[facility] in [facility] weeks.  Hypertension  Stable . Long-term BP goal 130-150 given severe intracranial stenosis  Hyperlipidemia  Lipid lowering medication PTA:  Lipitor 80 mg daily and Zetia 10 mg daily  LDL 83, goal < 70  Current lipid lowering medication: Lipitor 80 mg daily and Zetia 10 mg daily  Continue statin at discharge  Diabetes, type II  HgbA1c 6.9, goal < 7.0  Controlled  Home meds includes insulin and glimepiride  Glucose under control in the hospital  SSI  CBG monitoring  Other Stroke Risk Factors  Obesity, Body mass index is 34.52 kg/m., recommend weight loss, diet and exercise as appropriate   Other Active Problems  Elevated creatinine 1.30->1.41->1.22  Cognitive impairment - residual deficit from previous stroke  Hospital day # 3 I had a long discussion the patient and mother with regards to need to be compliant with dual lead to therapy and aggressive risk factor modification. Return for elective left MCA stenting with Dr. Corliss Skainseveshwar in 1-2 weeks. Discussed with Dr. Thedore MinsSingh. Greater than 50% time during this 25 minute visit was spent in counseling and coordination of care about stroke and answered questions Delia HeadyPramod Sethi, MD Stroke Neurology 08/17/2018 2:33 PM    To contact Stroke Continuity provider, please refer to [email or web address]. After hours, contact General Neurology

## 2018-08-17 NOTE — Progress Notes (Signed)
Went to see patient alongside Dr. Corliss Skainseveshwar. Patient is planned for an image-guided cerebral angiogram with left MCA M1 segment stenosis angioplasty/stent placement tentatively for tomorrow 08/18/2018.  Patient awake and alert sitting in chair. Accompanied by mother.  Informed patient that due to anesthesia booking, we are unable to accommodate her procedure tomorrow. Informed patient that procedure will be tentatively for 08/26/2018 at 0830 (handout for procedure day information given to patient). Informed patient that since there is no indication for her to stay in hospital until procedure, she will be discharged and procedure will occur on an outpatient basis. Dr. Thedore MinsSingh aware of procedure date change.  Discussed patient's P2Y12 result from today- 279 PRU. Informed patient that based on this result, Dr. Corliss Skainseveshwar is going to alter her medications. Explained this lab result indicates that patient is resistant to Plavix. Discontinue Plavix use and Aspirin 325 mg once daily use. Instructed patient to begin taking Brilinta 90 mg twice daily and Aspirin 81 mg once daily. Brilinta samples and discount card given to patient. Patient to take Brilinta 180 mg once today prior to discharge, Lauren RN and Dr. Thedore MinsSingh aware of medication changes.  All questions answered and concerns addressed. Patient and mother convey understanding and agree with plan.  Julia Bogalexandra M Avia Merkley, PA-C 08/17/2018, 11:39 AM

## 2018-08-18 ENCOUNTER — Encounter (HOSPITAL_COMMUNITY): Payer: Self-pay | Admitting: Interventional Radiology

## 2018-08-18 ENCOUNTER — Other Ambulatory Visit (HOSPITAL_COMMUNITY): Payer: Self-pay | Admitting: Interventional Radiology

## 2018-08-18 DIAGNOSIS — I771 Stricture of artery: Secondary | ICD-10-CM

## 2018-08-18 LAB — ANCA TITERS
Atypical P-ANCA titer: <1:20 {titer}
C-ANCA: <1:20 {titer}
P-ANCA: <1:20 {titer}

## 2018-08-18 LAB — CARDIOLIPIN ANTIBODIES, IGG, IGM, IGA
ANTICARDIOLIPIN IGM: 9 [MPL'U]/mL (ref 0–12)
Anticardiolipin IgG: <9 GPL U/mL (ref 0–14)

## 2018-08-19 ENCOUNTER — Encounter (HOSPITAL_COMMUNITY): Payer: Self-pay

## 2018-08-19 ENCOUNTER — Other Ambulatory Visit: Payer: Self-pay | Admitting: *Deleted

## 2018-08-19 LAB — FACTOR 5 LEIDEN

## 2018-08-19 LAB — PROTHROMBIN GENE MUTATION

## 2018-08-19 NOTE — Patient Outreach (Signed)
[facility]) Care Management  08/19/2018  Phoebe Sharpsammel Tortorelli 12/12/1970 [id number]   EMMI- stroke  RED ON EMMI ALERT Day # 1 Date: 08/18/18 1003 Red Alert Reason: Questions/problems with meds? yes Know how/when to take meds?no   [place] [facility] [place]  ED visits in the last 6 months   Outreach attempt # 1 No answer. [facility] RN CM left HIPAA compliant voicemail message along with CM's contact info.   Plan: [facility] CM sent an unsuccessful outreach letter and scheduled this patient for another call attempt within 4 business days  Mekia Dipinto L. Noelle PennerGibbs, RN, BSN, CCM [facility] Telephonic Care Management Care Coordinator Office number [phone] Mobile number [phone]  Main [facility] number [phone] Fax number [phone]

## 2018-08-24 ENCOUNTER — Encounter (HOSPITAL_COMMUNITY): Payer: Self-pay | Admitting: *Deleted

## 2018-08-24 ENCOUNTER — Ambulatory Visit: Payer: Self-pay | Admitting: *Deleted

## 2018-08-24 ENCOUNTER — Other Ambulatory Visit: Payer: Self-pay

## 2018-08-24 ENCOUNTER — Other Ambulatory Visit: Payer: Self-pay | Admitting: Student

## 2018-08-24 NOTE — Progress Notes (Signed)
Pt denies SOB, chest pain, and being under the care of a cardiologist. Pt denies having a stress test and cardiac cath. Pt gave verbal permission for her mother, Malachi BondsGloria, to complete her SDW-Pre-op Assessment. Mother stated that she was instructed that pt is not to take any medication or insulin on the DOS; mother stated that she was instructed to bring in medication and pt could take it at 8:00 A.M. on DOS. Mother made aware to have pt stop taking vitamins, fish oil and herbal medications. Do not take any NSAIDs ie: Ibuprofen, Advil, Naproxen (Aleve), Motrin, BC and Goody Powder. Mother made aware to have pt check BG every 2 hours prior to arrival to hospital on DOS. Pt made aware to treat a BG < 70 with 4 glucose tabs , wait 15 minutes after intervention to recheck BG, if BG remains < 70, call Short Stay unit to speak with a nurse. Mother verbalized understanding of all pre-op instructions.

## 2018-08-25 ENCOUNTER — Other Ambulatory Visit: Payer: Self-pay | Admitting: Radiology

## 2018-08-25 ENCOUNTER — Other Ambulatory Visit: Payer: Self-pay | Admitting: Student

## 2018-08-25 ENCOUNTER — Other Ambulatory Visit: Payer: Self-pay | Admitting: *Deleted

## 2018-08-25 NOTE — Patient Outreach (Addendum)
[facility]) Care Management  08/25/2018  Julia Ferguson 10/06/1970 [id number]   EMMI- stroke  RED ON EMMI ALERT Day # 1 Date: 08/18/18 1003 Red Alert Reason: Questions/problems with meds? yes Know how/when to take meds?no   [place] [facility]  ED visits in the last 6 months   Outreach attempt # 2 successful  Patient's mother is able to verify HIPAA Reviewed and addressed EMMI red alert[facility] with patient's mother, Julia BondsGloria  All questions about when and how to take medications were resolved when they went to primary care doctor office on 08/19/18 Wednesday Reviewed the 2 noted after summary sheets in Epic with her mother  Marden NobleBrilinta (not listed on after summary sheet but discussed by MD prior to d/c) given to pt at 10 am and 2000 The ASA was changed from 325 to 81 mg qd Reviewed the [facility] pharmacy resource for any possible future needs   All medical concerns denied today   Social: Julia Ferguson is divorced and lives with her family since her stroke Her primary caregiver is her mother, Julia BondsGloria (who worked in the medical field per mother) and her sister assists. She is able to move around well per her mother but does need assist with all ADLs and iADLs.  The family provides transportation to medical appointments Conditions:HTN, Stroke, HDL, DM 2 past strokes No falls  No DME needs reported has cane, walker  Medications: denies concerns with taking medications as prescribed, affording medications, side effects of medications and questions about medications  Appointments: To see primary MD Dr Primitivo GauzePlunk in 2 weeks, last sen by Dr Primitivo GauzePlunk on 08/19/18 and to see Dr Thayer Jewevonshire on 09/26/17  Neurology to be seen on 09/18/18   Advance Directives: Denies need for assist with advance directives    Consent: [facility] CM reviewed [facility] services with patient. Patient gave verbal consent for services.   Plan [facility] RN CM will close  case at this time as patient has been assessed and no needs identified.   [facility] RN CM referred her mother to the outreach letter  with [facility] brochure enclosed for review  Pt encouraged to return a call to [facility] RN CM prn  [facility] RN CM will close case at this time as patient has been assessed and needs resolved.  Kimberly L. Noelle PennerGibbs, RN, BSN, CCM [facility] Telephonic Care Management Care Coordinator Office number [phone] Mobile number [phone]  Main [facility] number [phone] Fax number [phone]

## 2018-08-26 ENCOUNTER — Encounter (HOSPITAL_COMMUNITY)
Admission: AD | Disposition: A | Discharge status: Home / Self Care | Payer: Self-pay | Source: Home / Self Care | Attending: Interventional Radiology

## 2018-08-26 ENCOUNTER — Ambulatory Visit (HOSPITAL_COMMUNITY): Payer: Medicaid - Out of State | Admitting: Anesthesiology

## 2018-08-26 ENCOUNTER — Ambulatory Visit (HOSPITAL_COMMUNITY)
Admission: RE | Admit: 2018-08-26 | Discharge: 2018-08-26 | Disposition: A | Discharge status: Home / Self Care | Payer: Medicaid - Out of State | Source: Ambulatory Visit | Attending: Interventional Radiology | Admitting: Interventional Radiology

## 2018-08-26 ENCOUNTER — Inpatient Hospital Stay (HOSPITAL_COMMUNITY)
Admission: AD | Admit: 2018-08-26 | Discharge: 2018-08-27 | DRG: 982 | Disposition: A | Discharge status: Home / Self Care | Payer: Medicaid - Out of State | Attending: Interventional Radiology | Admitting: Interventional Radiology

## 2018-08-26 ENCOUNTER — Other Ambulatory Visit: Payer: Self-pay

## 2018-08-26 ENCOUNTER — Encounter (HOSPITAL_COMMUNITY): Payer: Self-pay | Admitting: Surgery

## 2018-08-26 DIAGNOSIS — Z8673 Personal history of transient ischemic attack (TIA), and cerebral infarction without residual deficits: Secondary | ICD-10-CM | POA: Insufficient documentation

## 2018-08-26 DIAGNOSIS — Z7982 Long term (current) use of aspirin: Secondary | ICD-10-CM

## 2018-08-26 DIAGNOSIS — Z794 Long term (current) use of insulin: Secondary | ICD-10-CM | POA: Insufficient documentation

## 2018-08-26 DIAGNOSIS — E119 Type 2 diabetes mellitus without complications: Secondary | ICD-10-CM | POA: Diagnosis present

## 2018-08-26 DIAGNOSIS — E785 Hyperlipidemia, unspecified: Secondary | ICD-10-CM | POA: Diagnosis not present

## 2018-08-26 DIAGNOSIS — E1122 Type 2 diabetes mellitus with diabetic chronic kidney disease: Secondary | ICD-10-CM

## 2018-08-26 DIAGNOSIS — M21371 Foot drop, right foot: Secondary | ICD-10-CM | POA: Diagnosis present

## 2018-08-26 DIAGNOSIS — Z90710 Acquired absence of both cervix and uterus: Secondary | ICD-10-CM

## 2018-08-26 DIAGNOSIS — I10 Essential (primary) hypertension: Secondary | ICD-10-CM | POA: Diagnosis present

## 2018-08-26 DIAGNOSIS — I771 Stricture of artery: Secondary | ICD-10-CM

## 2018-08-26 DIAGNOSIS — Z7902 Long term (current) use of antithrombotics/antiplatelets: Secondary | ICD-10-CM

## 2018-08-26 DIAGNOSIS — I129 Hypertensive chronic kidney disease with stage 1 through stage 4 chronic kidney disease, or unspecified chronic kidney disease: Secondary | ICD-10-CM | POA: Insufficient documentation

## 2018-08-26 DIAGNOSIS — Z79899 Other long term (current) drug therapy: Secondary | ICD-10-CM

## 2018-08-26 DIAGNOSIS — N189 Chronic kidney disease, unspecified: Secondary | ICD-10-CM

## 2018-08-26 DIAGNOSIS — R569 Unspecified convulsions: Secondary | ICD-10-CM

## 2018-08-26 DIAGNOSIS — E1159 Type 2 diabetes mellitus with other circulatory complications: Secondary | ICD-10-CM | POA: Diagnosis not present

## 2018-08-26 DIAGNOSIS — Z833 Family history of diabetes mellitus: Secondary | ICD-10-CM | POA: Diagnosis not present

## 2018-08-26 DIAGNOSIS — I69351 Hemiplegia and hemiparesis following cerebral infarction affecting right dominant side: Secondary | ICD-10-CM

## 2018-08-26 DIAGNOSIS — Z79891 Long term (current) use of opiate analgesic: Secondary | ICD-10-CM | POA: Diagnosis not present

## 2018-08-26 DIAGNOSIS — I6602 Occlusion and stenosis of left middle cerebral artery: Secondary | ICD-10-CM

## 2018-08-26 DIAGNOSIS — Z8249 Family history of ischemic heart disease and other diseases of the circulatory system: Secondary | ICD-10-CM | POA: Diagnosis not present

## 2018-08-26 HISTORY — DX: Weakness: R53.1

## 2018-08-26 HISTORY — DX: Chronic kidney disease, unspecified: N18.9

## 2018-08-26 HISTORY — PX: IR PTA INTRACRANIAL: IMG2344

## 2018-08-26 HISTORY — PX: RADIOLOGY WITH ANESTHESIA: SHX6223

## 2018-08-26 LAB — BASIC METABOLIC PANEL
Anion gap: 13 (ref 5–15)
BUN: 17 mg/dL (ref 6–20)
CO2: 21 mmol/L — ABNORMAL LOW (ref 22–32)
Calcium: 9.2 mg/dL (ref 8.9–10.3)
Chloride: 104 mmol/L (ref 98–111)
Creatinine, Ser: 1.29 mg/dL — ABNORMAL HIGH (ref 0.44–1.00)
GFR calc Af Amer: 56 mL/min — ABNORMAL LOW (ref >60)
GFR calc non Af Amer: 49 mL/min — ABNORMAL LOW (ref >60)
Glucose, Bld: 202 mg/dL — ABNORMAL HIGH (ref 70–99)
Potassium: 4 mmol/L (ref 3.5–5.1)
Sodium: 138 mmol/L (ref 135–145)

## 2018-08-26 LAB — POCT ACTIVATED CLOTTING TIME
Activated Clotting Time: 219 seconds
Activated Clotting Time: 180 seconds

## 2018-08-26 LAB — PLATELET INHIBITION P2Y12: Platelet Function  P2Y12: 32 [PRU] — ABNORMAL LOW (ref 194–418)

## 2018-08-26 LAB — CBC
HCT: 33.9 % — ABNORMAL LOW (ref 36.0–46.0)
Hemoglobin: 10.5 g/dL — ABNORMAL LOW (ref 12.0–15.0)
MCH: 26.3 pg (ref 26.0–34.0)
MCHC: 31 g/dL (ref 30.0–36.0)
MCV: 85 fL (ref 80.0–100.0)
Platelets: 406 10*3/uL — ABNORMAL HIGH (ref 150–400)
RBC: 3.99 MIL/uL (ref 3.87–5.11)
RDW: 13.2 % (ref 11.5–15.5)
WBC: 7.9 10*3/uL (ref 4.0–10.5)
nRBC: 0 % (ref 0.0–0.2)

## 2018-08-26 LAB — APTT: aPTT: 29 seconds (ref 24–36)

## 2018-08-26 LAB — MRSA PCR SCREENING: MRSA by PCR: NEGATIVE

## 2018-08-26 LAB — GLUCOSE, CAPILLARY
GLUCOSE-CAPILLARY: 187 mg/dL — AB (ref 70–99)
Glucose-Capillary: 162 mg/dL — ABNORMAL HIGH (ref 70–99)
Glucose-Capillary: 166 mg/dL — ABNORMAL HIGH (ref 70–99)
Glucose-Capillary: 204 mg/dL — ABNORMAL HIGH (ref 70–99)

## 2018-08-26 LAB — PROTIME-INR
INR: 0.99
Prothrombin Time: 13 seconds (ref 11.4–15.2)

## 2018-08-26 SURGERY — RADIOLOGY WITH ANESTHESIA
Anesthesia: General

## 2018-08-26 MED ORDER — HYDROMORPHONE HCL 1 MG/ML IJ SOLN: 1.0000 mg | Freq: Once | INTRAMUSCULAR | Status: DC

## 2018-08-26 MED ORDER — NITROGLYCERIN 1 MG/10 ML FOR IR/CATH LAB
INTRA_ARTERIAL | Status: AC
  Filled 2018-08-26: qty 10

## 2018-08-26 MED ORDER — INSULIN ASPART 100 UNIT/ML ~~LOC~~ SOLN: 0.0000 [IU] | Freq: Three times a day (TID) | SUBCUTANEOUS | Status: DC

## 2018-08-26 MED ORDER — INSULIN ASPART 100 UNIT/ML ~~LOC~~ SOLN
0.0000 [IU] | Freq: Three times a day (TID) | SUBCUTANEOUS | Status: DC
  Administered 2018-08-26: 3 [IU] via SUBCUTANEOUS
  Administered 2018-08-27: 8 [IU] via SUBCUTANEOUS
  Administered 2018-08-27: 3 [IU] via SUBCUTANEOUS

## 2018-08-26 MED ORDER — HYDROMORPHONE HCL 1 MG/ML IJ SOLN
1.0000 mg | Freq: Once | INTRAMUSCULAR | Status: AC
  Administered 2018-08-26: 1 mg via INTRAVENOUS
  Filled 2018-08-26: qty 1

## 2018-08-26 MED ORDER — SODIUM CHLORIDE 0.9 % IV SOLN
INTRAVENOUS | Status: DC
  Administered 2018-08-26: 12:00:00 via INTRAVENOUS

## 2018-08-26 MED ORDER — HEPARIN (PORCINE) 25000 UT/250ML-% IV SOLN: 250.0000 [IU]/h | INTRAVENOUS | Status: AC

## 2018-08-26 MED ORDER — CLEVIDIPINE BUTYRATE 0.5 MG/ML IV EMUL
0.0000 mg/h | INTRAVENOUS | Status: DC
  Administered 2018-08-26: 21 mg/h via INTRAVENOUS
  Administered 2018-08-26 – 2018-08-27 (×5): 19 mg/h via INTRAVENOUS
  Filled 2018-08-26 (×7): qty 50

## 2018-08-26 MED ORDER — SODIUM CHLORIDE 0.9 % IV SOLN
INTRAVENOUS | Status: DC | PRN
  Administered 2018-08-26: 25 ug/min via INTRAVENOUS

## 2018-08-26 MED ORDER — CEFAZOLIN SODIUM-DEXTROSE 2-4 GM/100ML-% IV SOLN
INTRAVENOUS | Status: AC
  Filled 2018-08-26: qty 100

## 2018-08-26 MED ORDER — LIDOCAINE HCL (CARDIAC) PF 100 MG/5ML IV SOSY
PREFILLED_SYRINGE | INTRAVENOUS | Status: DC | PRN
  Administered 2018-08-26: 60 mg via INTRATRACHEAL

## 2018-08-26 MED ORDER — PROPOFOL 10 MG/ML IV BOLUS
INTRAVENOUS | Status: DC | PRN
  Administered 2018-08-26: 150 mg via INTRAVENOUS
  Administered 2018-08-26: 20 mg via INTRAVENOUS
  Administered 2018-08-26: 50 mg via INTRAVENOUS

## 2018-08-26 MED ORDER — HEPARIN (PORCINE) 25000 UT/250ML-% IV SOLN
600.0000 [IU]/h | INTRAVENOUS | Status: DC
  Administered 2018-08-26: 500 [IU]/h via INTRAVENOUS

## 2018-08-26 MED ORDER — DEXAMETHASONE SODIUM PHOSPHATE 10 MG/ML IJ SOLN
INTRAMUSCULAR | Status: DC | PRN
  Administered 2018-08-26: 4 mg via INTRAVENOUS

## 2018-08-26 MED ORDER — PANTOPRAZOLE SODIUM 40 MG PO TBEC: 40.0000 mg | DELAYED_RELEASE_TABLET | Freq: Every day | ORAL | Status: DC

## 2018-08-26 MED ORDER — SUGAMMADEX SODIUM 200 MG/2ML IV SOLN
INTRAVENOUS | Status: DC | PRN
  Administered 2018-08-26: 200 mg via INTRAVENOUS

## 2018-08-26 MED ORDER — CLEVIDIPINE BUTYRATE 0.5 MG/ML IV EMUL
INTRAVENOUS | Status: AC
  Filled 2018-08-26: qty 50

## 2018-08-26 MED ORDER — CLEVIDIPINE BUTYRATE 0.5 MG/ML IV EMUL
0.0000 mg/h | INTRAVENOUS | Status: DC
  Administered 2018-08-26: 20 mg/h via INTRAVENOUS
  Filled 2018-08-26: qty 50

## 2018-08-26 MED ORDER — CARVEDILOL 3.125 MG PO TABS
6.2500 mg | ORAL_TABLET | Freq: Two times a day (BID) | ORAL | Status: DC
  Administered 2018-08-26 – 2018-08-27 (×2): 6.25 mg via ORAL
  Filled 2018-08-26 (×3): qty 2

## 2018-08-26 MED ORDER — IOHEXOL 300 MG/ML  SOLN
300.0000 mL | Freq: Once | INTRAMUSCULAR | Status: AC | PRN
  Administered 2018-08-26: 100 mL via INTRA_ARTERIAL

## 2018-08-26 MED ORDER — CARVEDILOL 6.25 MG PO TABS: 6.2500 mg | ORAL_TABLET | Freq: Once | ORAL | Status: DC

## 2018-08-26 MED ORDER — ACETAMINOPHEN 650 MG RE SUPP: 650.0000 mg | RECTAL | Status: DC | PRN

## 2018-08-26 MED ORDER — ASPIRIN 81 MG PO CHEW
81.0000 mg | CHEWABLE_TABLET | Freq: Every day | ORAL | Status: DC
  Administered 2018-08-27: 81 mg via ORAL
  Filled 2018-08-26: qty 1

## 2018-08-26 MED ORDER — GABAPENTIN 600 MG PO TABS
600.0000 mg | ORAL_TABLET | Freq: Every day | ORAL | Status: DC
  Administered 2018-08-26: 600 mg via ORAL
  Filled 2018-08-26: qty 1

## 2018-08-26 MED ORDER — ROCURONIUM BROMIDE 100 MG/10ML IV SOLN
INTRAVENOUS | Status: DC | PRN
  Administered 2018-08-26: 50 mg via INTRAVENOUS
  Administered 2018-08-26: 30 mg via INTRAVENOUS

## 2018-08-26 MED ORDER — LEVETIRACETAM 500 MG PO TABS
500.0000 mg | ORAL_TABLET | Freq: Two times a day (BID) | ORAL | Status: DC
  Administered 2018-08-26 – 2018-08-27 (×2): 500 mg via ORAL
  Filled 2018-08-26 (×2): qty 1

## 2018-08-26 MED ORDER — ATORVASTATIN CALCIUM 80 MG PO TABS
80.0000 mg | ORAL_TABLET | Freq: Every evening | ORAL | Status: DC
  Administered 2018-08-26: 80 mg via ORAL
  Filled 2018-08-26: qty 1

## 2018-08-26 MED ORDER — LABETALOL HCL 5 MG/ML IV SOLN: 5.0000 mg | INTRAVENOUS | Status: DC | PRN

## 2018-08-26 MED ORDER — PROMETHAZINE HCL 25 MG/ML IJ SOLN: 6.2500 mg | INTRAMUSCULAR | Status: DC | PRN

## 2018-08-26 MED ORDER — FENTANYL CITRATE (PF) 250 MCG/5ML IJ SOLN
INTRAMUSCULAR | Status: DC | PRN
  Administered 2018-08-26 (×2): 50 ug via INTRAVENOUS
  Administered 2018-08-26: 100 ug via INTRAVENOUS

## 2018-08-26 MED ORDER — HEPARIN SODIUM (PORCINE) 1000 UNIT/ML IJ SOLN
INTRAMUSCULAR | Status: DC | PRN
  Administered 2018-08-26: 3000 [IU] via INTRAVENOUS

## 2018-08-26 MED ORDER — LOSARTAN POTASSIUM 50 MG PO TABS: 50.0000 mg | ORAL_TABLET | Freq: Once | ORAL | Status: DC

## 2018-08-26 MED ORDER — OXYCODONE HCL 5 MG/5ML PO SOLN: 5.0000 mg | Freq: Once | ORAL | Status: DC | PRN

## 2018-08-26 MED ORDER — ESMOLOL HCL 100 MG/10ML IV SOLN
INTRAVENOUS | Status: DC | PRN
  Administered 2018-08-26 (×5): 20 mg via INTRAVENOUS

## 2018-08-26 MED ORDER — CLEVIDIPINE BUTYRATE 0.5 MG/ML IV EMUL
INTRAVENOUS | Status: DC | PRN
  Administered 2018-08-26: 2 mg/h via INTRAVENOUS

## 2018-08-26 MED ORDER — TICAGRELOR 90 MG PO TABS: 90.0000 mg | ORAL_TABLET | Freq: Two times a day (BID) | ORAL | Status: DC

## 2018-08-26 MED ORDER — ASPIRIN 81 MG PO CHEW: 81.0000 mg | CHEWABLE_TABLET | Freq: Every day | ORAL | Status: DC

## 2018-08-26 MED ORDER — TICAGRELOR 90 MG PO TABS
90.0000 mg | ORAL_TABLET | Freq: Two times a day (BID) | ORAL | Status: DC
  Administered 2018-08-26 – 2018-08-27 (×2): 90 mg via ORAL
  Filled 2018-08-26 (×2): qty 1

## 2018-08-26 MED ORDER — ASPIRIN EC 325 MG PO TBEC
325.0000 mg | DELAYED_RELEASE_TABLET | ORAL | Status: DC
  Filled 2018-08-26: qty 1

## 2018-08-26 MED ORDER — HYDROMORPHONE HCL 1 MG/ML IJ SOLN
INTRAMUSCULAR | Status: AC
  Administered 2018-08-26: 0.5 mg via INTRAVENOUS
  Filled 2018-08-26: qty 1

## 2018-08-26 MED ORDER — TICAGRELOR 90 MG PO TABS
90.0000 mg | ORAL_TABLET | Freq: Once | ORAL | Status: AC
  Administered 2018-08-26: 90 mg via ORAL
  Filled 2018-08-26: qty 1

## 2018-08-26 MED ORDER — OXYCODONE HCL 5 MG PO TABS: 5.0000 mg | ORAL_TABLET | Freq: Once | ORAL | Status: DC | PRN

## 2018-08-26 MED ORDER — NIMODIPINE 30 MG PO CAPS
0.0000 mg | ORAL_CAPSULE | ORAL | Status: DC
  Filled 2018-08-26: qty 2

## 2018-08-26 MED ORDER — ONDANSETRON HCL 4 MG/2ML IJ SOLN: 4.0000 mg | Freq: Four times a day (QID) | INTRAMUSCULAR | Status: DC | PRN

## 2018-08-26 MED ORDER — CEFAZOLIN SODIUM-DEXTROSE 2-4 GM/100ML-% IV SOLN
2.0000 g | INTRAVENOUS | Status: AC
  Administered 2018-08-26: 2 g via INTRAVENOUS
  Filled 2018-08-26: qty 100

## 2018-08-26 MED ORDER — INSULIN ASPART 100 UNIT/ML ~~LOC~~ SOLN
0.0000 [IU] | Freq: Every day | SUBCUTANEOUS | Status: DC
  Administered 2018-08-26: 2 [IU] via SUBCUTANEOUS

## 2018-08-26 MED ORDER — SODIUM CHLORIDE 0.9 % IV SOLN
INTRAVENOUS | Status: DC
  Administered 2018-08-26 – 2018-08-27 (×2): via INTRAVENOUS

## 2018-08-26 MED ORDER — HYDROMORPHONE HCL 1 MG/ML IJ SOLN
0.2500 mg | INTRAMUSCULAR | Status: DC | PRN
  Administered 2018-08-26: 0.5 mg via INTRAVENOUS

## 2018-08-26 MED ORDER — LABETALOL HCL 5 MG/ML IV SOLN
INTRAVENOUS | Status: DC | PRN
  Administered 2018-08-26 (×2): 10 mg via INTRAVENOUS

## 2018-08-26 MED ORDER — ASPIRIN 81 MG PO CHEW: 81.0000 mg | CHEWABLE_TABLET | Freq: Once | ORAL | Status: DC

## 2018-08-26 MED ORDER — EZETIMIBE 10 MG PO TABS
10.0000 mg | ORAL_TABLET | Freq: Every day | ORAL | Status: DC
  Administered 2018-08-26 – 2018-08-27 (×2): 10 mg via ORAL
  Filled 2018-08-26 (×2): qty 1

## 2018-08-26 MED ORDER — ACETAMINOPHEN 160 MG/5ML PO SOLN: 650.0000 mg | ORAL | Status: DC | PRN

## 2018-08-26 MED ORDER — LOSARTAN POTASSIUM 50 MG PO TABS
50.0000 mg | ORAL_TABLET | Freq: Every day | ORAL | Status: DC
  Administered 2018-08-27: 50 mg via ORAL
  Filled 2018-08-26: qty 1

## 2018-08-26 MED ORDER — MEPERIDINE HCL 50 MG/ML IJ SOLN: 6.2500 mg | INTRAMUSCULAR | Status: DC | PRN

## 2018-08-26 MED ORDER — NITROGLYCERIN 1 MG/10 ML FOR IR/CATH LAB
INTRA_ARTERIAL | Status: DC | PRN
  Administered 2018-08-26 (×3): 25 ug via INTRA_ARTERIAL

## 2018-08-26 MED ORDER — TRAMADOL HCL 50 MG PO TABS
50.0000 mg | ORAL_TABLET | Freq: Every day | ORAL | Status: DC | PRN
  Administered 2018-08-26: 50 mg via ORAL
  Filled 2018-08-26: qty 1

## 2018-08-26 MED ORDER — ASPIRIN 81 MG PO CHEW
CHEWABLE_TABLET | ORAL | Status: AC
  Administered 2018-08-26: 81 mg
  Filled 2018-08-26: qty 1

## 2018-08-26 MED ORDER — LACTATED RINGERS IV SOLN
INTRAVENOUS | Status: DC | PRN
  Administered 2018-08-26: 08:00:00 via INTRAVENOUS

## 2018-08-26 MED ORDER — CLEVIDIPINE BUTYRATE 0.5 MG/ML IV EMUL
0.0000 mg/h | INTRAVENOUS | Status: DC
  Administered 2018-08-26: 20 mg/h via INTRAVENOUS
  Administered 2018-08-26: 6 mg/h via INTRAVENOUS

## 2018-08-26 MED ORDER — ACETAMINOPHEN 325 MG PO TABS: 650.0000 mg | ORAL_TABLET | ORAL | Status: DC | PRN

## 2018-08-26 MED ORDER — HEPARIN (PORCINE) 25000 UT/250ML-% IV SOLN
INTRAVENOUS | Status: AC
  Filled 2018-08-26: qty 250

## 2018-08-26 MED ORDER — MIDAZOLAM HCL 5 MG/5ML IJ SOLN
INTRAMUSCULAR | Status: DC | PRN
  Administered 2018-08-26: 2 mg via INTRAVENOUS

## 2018-08-26 MED ORDER — NIACIN 100 MG PO TABS
100.0000 mg | ORAL_TABLET | Freq: Three times a day (TID) | ORAL | Status: DC
  Administered 2018-08-26 – 2018-08-27 (×3): 100 mg via ORAL
  Filled 2018-08-26 (×4): qty 1

## 2018-08-26 NOTE — Anesthesia Preprocedure Evaluation (Signed)
Anesthesia Evaluation  Patient identified by MRN, date of birth, ID band Patient awake    Reviewed: Allergy & Precautions, NPO status , Patient's Chart, lab work & pertinent test results, reviewed documented beta blocker date and time   Airway Mallampati: II  TM Distance: >3 FB Neck ROM: Full    Dental no notable dental hx.    Pulmonary neg pulmonary ROS,    Pulmonary exam normal breath sounds clear to auscultation       Cardiovascular hypertension, Pt. on medications and Pt. on home beta blockers negative cardio ROS Normal cardiovascular exam Rhythm:Regular Rate:Normal     Neuro/Psych CVA negative psych ROS   GI/Hepatic negative GI ROS, Neg liver ROS,   Endo/Other  negative endocrine ROSdiabetes, Type 2  Renal/GU negative Renal ROS  negative genitourinary   Musculoskeletal negative musculoskeletal ROS (+)   Abdominal (+) + obese,   Peds negative pediatric ROS (+)  Hematology negative hematology ROS (+)   Anesthesia Other Findings   Reproductive/Obstetrics negative OB ROS                             Anesthesia Physical Anesthesia Plan  ASA: III  Anesthesia Plan: General   Post-op Pain Management:    Induction: Intravenous  PONV Risk Score and Plan: 3 and Ondansetron, Dexamethasone and Midazolam  Airway Management Planned: Oral ETT  Additional Equipment: Arterial line  Intra-op Plan:   Post-operative Plan: Extubation in OR  Informed Consent: I have reviewed the patients History and Physical, chart, labs and discussed the procedure including the risks, benefits and alternatives for the proposed anesthesia with the patient or authorized representative who has indicated his/her understanding and acceptance.   Dental advisory given  Plan Discussed with: CRNA  Anesthesia Plan Comments:         Anesthesia Quick Evaluation

## 2018-08-26 NOTE — Progress Notes (Signed)
Received page from RN stating patient's left groin incision was bleeding. Went to evaluate patient with Dr. Corliss Skainseveshwar.  Patient awake and alert laying in bed with no complaints at this time. Can spontaneously move all extremities. Left groin incision with hardness at superior/lateral aspect of incision.  RN held pressure for 20 minutes until hemostasis achieved. Site soft. Pressure dressing applied. Patient to remain flat with leg straight for 4 hours.  Heparin stopped at 1329. ACT at 1450 was 180. Per Dr. Corliss Skainseveshwar- restart Heparin in 1 hour.  Julia Bogalexandra M Louk, PA-C 08/26/2018, 3:38 PM

## 2018-08-26 NOTE — Sedation Documentation (Addendum)
Left groin sheath removed, 416fr angioseal closure device used. Manual pressure being held at left groin site.

## 2018-08-26 NOTE — Consult Note (Addendum)
Stroke Neurology Consultation Note  Consult Requested by: Dr. Corliss Skains  Reason for Consult: hx of stroke s/p angioplasty  Consult Date: 08/26/18  The history was obtained from the patient and her mom.  During history and examination, all items were able to obtain unless otherwise noted.  History of Present Illness:  Julia Ferguson is a 47 y.o. African American female with PMH of stroke, hypertension and diabetes admitted for left MCA angioplasty procedure.  Patient had multiple stroke in the past.  In 2017 while she was working she had headache with high blood pressure, admitted to hospital for 3 to 4 days due to stroke which was contributed to BP and DM not in good control.  No significant residual deficit after.  However, she had a second stroke in 2018 with right-sided weakness and speech difficulty, admitted to hospital for 5 days stroke.  Stroke was contributed to HTN, diabetes and a stressful job.  She was discharged with aspirin and Plavix.  In 07/2018 she was admitted again to [facility] with worsening right-sided weakness and speech difficulty.  Stayed there for 1 week and a stroke work-up showed new left MCA infarct and discharged with continued on aspirin and Plavix.  She was admitted on 08/13/2018 for increased right-sided weakness after multiple episode of staring spells, right arm shaking jerking with tonic flexion lasting 15 to 30 seconds.  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  She was treated with Keppra load and then 500 mg twice daily.  EEG no seizure but left hemispheric intermittent slowing.  CTA head and neck showed high-grade stenosis of left ICA siphon and left M1 segment.  MRI showed acute to subacute left small parietal MCA territory infarct along with multiple old infarct involving left ACA and left MCA.  Had cerebral angiogram by Dr. Corliss Skains showed severe left M1  stenosis, bilateral P1 stenosis, left ACA occlusion and left siphon 40% stenosis.  EF 65 to 70%, LDL 83 and A1c 6.9.  Hypercoagulable and autoimmune work-up negative with only slightly elevated homocystine level and rheumatoid factor.  Discussed with family and plan for left M1 angioplasty versus stenting, however, procedure was not able to be done during the admission.  She was scheduled to have elective angioplasty versus stenting done this week.  Her P2Y12 was not at goal therefore, her aspirin Plavix was changed to aspirin and Brilinta on discharge.  Her Lipitor and Zetia was continued on discharge.  She was also recommended to follow-up with outpatient with neurology to consider LP to rule out vasculitis although cerebral angiogram did not suggest vasculitis at that time.  Patient was readmitted today for scheduled angioplasty versus stenting of left M1.  During the procedure, angioplasty alone was able to maintain left MCA patency of 90%, therefore, intracranial stent was not performed.  Patient was admitted to ICU after procedure.  Neurology was consulted for assistance of management.   Past Medical History:  Diagnosis Date  . Chronic kidney disease   . Diabetes mellitus without complication (HCC)   . Hypertension   . Right sided weakness    post - stroke  . Stroke [facility])     Past Surgical History:  Procedure Laterality Date  . ABDOMINAL HYSTERECTOMY    . CESAREAN SECTION    . EYE SURGERY     laser   . IR ANGIO INTRA EXTRACRAN SEL COM CAROTID INNOMINATE BILAT MOD SED  08/15/2018  . IR ANGIO VERTEBRAL  SEL VERTEBRAL BILAT MOD SED  08/15/2018    Family History  Problem Relation Age of Onset  . Hypertension Other   . Kidney failure Other   . Hypertension Mother   . Diabetes Mother   . Colon cancer Father     Social History:  reports that she has never smoked. She has never used smokeless tobacco. She reports that she does not drink alcohol or use drugs.  Allergies: No Known  Allergies  No current facility-administered medications on file prior to encounter.    Current Outpatient Medications on File Prior to Encounter  Medication Sig Dispense Refill  . aspirin EC 81 MG tablet Take 81 mg by mouth daily.    Marland Kitchen atorvastatin (LIPITOR) 80 MG tablet Take 80 mg by mouth every evening.     . carvedilol (COREG) 6.25 MG tablet Take 1 tablet (6.25 mg total) by mouth 2 (two) times daily with a meal. 60 tablet 0  . ezetimibe (ZETIA) 10 MG tablet Take 10 mg by mouth daily.     Marland Kitchen gabapentin (NEURONTIN) 600 MG tablet Take 600 mg by mouth at bedtime.     . insulin aspart (NOVOLOG) 100 UNIT/ML injection Substitute to any brand approved. . Before each meal 3 times a day, 140-199 - 2 units, 200-250 - 4 units, 251-299 - 6 units,  300-349 - 8 units,  350 or above 10 units. Dispense syringes and needles as needed, Ok to switch to PEN if approved. DX DM2, Code E11.65 1 vial 12  . Insulin Glargine (BASAGLAR KWIKPEN) 100 UNIT/ML SOPN Inject 45 Units into the skin daily.     . Insulin Syringe-Needle U-100 25G X 1" 1 ML MISC For 4 times a day insulin SQ, 1 month supply. Diagnosis E11.65 30 each 0  . losartan (COZAAR) 100 MG tablet Take 50 mg by mouth daily.    . niacin 100 MG tablet Take 1 tablet (100 mg total) by mouth 3 (three) times daily with meals. 30 tablet 0  . pantoprazole (PROTONIX) 40 MG tablet Take 1 tablet (40 mg total) by mouth daily. 30 tablet 0  . ticagrelor (BRILINTA) 60 MG TABS tablet Take 1.5 tablets (90 mg total) by mouth 2 (two) times daily. 60 tablet 0  . aspirin EC 325 MG EC tablet Take 1 tablet (325 mg total) by mouth daily. (Patient not taking: Reported on 08/19/2018) 30 tablet 1  . traMADol (ULTRAM) 50 MG tablet Take 50 mg by mouth daily as needed for moderate pain.       Review of Systems: A full ROS was attempted today and was able to be performed.  Systems assessed include - Constitutional, Eyes, HENT, Respiratory, Cardiovascular, Gastrointestinal, Genitourinary,  Integument/breast, Hematologic/lymphatic, Musculoskeletal, Neurological, Behavioral/Psych, Endocrine, Allergic/Immunologic - with pertinent responses as per HPI.  Physical Examination: Temp:  [97.3 F (36.3 C)-98.4 F (36.9 C)] 98.4 F (36.9 C) (12/04 1545) Pulse Rate:  [83-96] 96 (12/04 1700) Resp:  [10-18] 12 (12/04 1700) BP: (132-194)/(67-109) 155/109 (12/04 1700) SpO2:  [94 %-100 %] 94 % (12/04 1700) Arterial Line BP: (151-191)/(71-104) 183/85 (12/04 1315)  General - Well nourished, well developed, in no apparent distress.  Ophthalmologic - fundi not visualized due to noncooperation.  Cardiovascular - Regular rate and rhythm.  Neuro - awake, alert, lying in bed, orientated to self, months and people, not orientated to year.  Able to follow simple commands, able to name 3 out of 3, able to repeat, paucity of speech, mild to moderate dysarthria, intermittent paraphasic errors.  Visual field full.  PERRL, EOMI.  Mild right facial droop, tongue midline.  Sensation symmetrical bilateral face.  Left upper and lower extremity 5/5 muscle strength, right upper extremity 3+/5 proximal, 3/5 distally.  Right lower extremity proximal not able to test due to procedure but 3+/5 foot DF.  Sensation symmetrical.  Bilateral finger-to-nose no ataxia but slow in action.  Gait not tested.  Data Reviewed: Ct Angio Head W Or Wo Contrast  Result Date: 08/13/2018 CLINICAL DATA:  Arterial stricture/occlusion of the head and neck. Weakness beginning 2 weeks ago. Recent admission for stroke. Episodes of shaking and absence staring. EXAM: CT ANGIOGRAPHY HEAD AND NECK CT PERFUSION BRAIN TECHNIQUE: Multidetector CT imaging of the head and neck was performed using the standard protocol during bolus administration of intravenous contrast. Multiplanar CT image reconstructions and MIPs were obtained to evaluate the vascular anatomy. Carotid stenosis measurements (when applicable) are obtained utilizing NASCET criteria,  using the distal internal carotid diameter as the denominator. Multiphase CT imaging of the brain was performed following IV bolus contrast injection. Subsequent parametric perfusion maps were calculated using RAPID software. CONTRAST:  ISOVUE-370 IOPAMIDOL (ISOVUE-370) INJECTION 76% COMPARISON:  MRI brain 08/13/2018 at 4:37 p.m. FINDINGS: CT HEAD FINDINGS Brain: Remote left ACA territory infarct is noted. Additional areas of remote left MCA territory infarcts are present. The areas of more acute infarct are less well delineated on noncontrast CT of the head. The right hemisphere is unremarkable. The brainstem and cerebellum are normal. Vascular: No hyperdense vessel or unexpected calcification. Skull: Calvarium is intact. No focal lytic or blastic lesions are present. Sinuses/Orbits: The paranasal sinuses mastoid air cells are clear. Globes and orbits are within normal limits. ASPECTS (Alberta Stroke Program Early CT Score) - Ganglionic level infarction (caudate, lentiform nuclei, internal capsule, insula, M1-M3 cortex): 7/7 - Supraganglionic infarction (M4-M6 cortex): 3/3 Total score (0-10 with 10 being normal): 10/10 Review of the MIP images confirms the above findings CTA NECK FINDINGS Aortic arch: A three-vessel arch configuration is present. There is no significant calcification at the aortic arch or stenosis. Right carotid system: Right common carotid artery is within normal limits. Right ICA bifurcation scratched at the right carotid bifurcation is normal. There is tortuosity of the distal right ICA without significant stenosis. Left carotid system: The left common carotid artery is within normal limits. Bifurcation is unremarkable. There is moderate tortuosity of the distal cervical ICA without significant stenosis. Vertebral arteries: The vertebral arteries originate from the subclavian arteries bilaterally. There is a high-grade stenosis of the left vertebral artery at its origin. The right  vertebral artery is the dominant vessel. No other focal stenosis or vascular injury is present. Skeleton: Vertebral body heights alignment are maintained. 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 No focal lytic or blastic lesions are present. Hyperostosis is noted. Other neck: Subcentimeter nodules are present within the right lobe of the thyroid. No dominant lesion is present. No follow-up is necessary. No significant cervical adenopathy is present. Salivary glands are within normal limits. No focal mucosal lesions are present. Upper chest: There is dependent atelectasis at the lung apices. Mild interstitial coarsening suggests mild edema. Review of the MIP images confirms the above findings CTA HEAD FINDINGS Anterior circulation: Atherosclerotic changes are present within the cavernous internal carotid arteries bilaterally. The lumen of the right ICA is narrowed to 1.5 mm. This compares to more distal vessel of 3 mm. There is a high-grade stenosis of the paraclinoid left ICA. There is severe stenosis  of the mid left basilar artery measuring less than 1 mm. This compares with 2.6 mm at the bifurcation. More mild atherosclerotic changes are present in the right M1 segment. Right MCA bifurcation is intact. Left MCA bifurcation is intact. There is some attenuation of distal MCA branch vessels. Chronic left ACA occlusion is noted. Posterior circulation: The right vertebral artery is the dominant vessel. PICA origins are visualized and normal. Vertebrobasilar junction is normal. Basilar artery is within normal limits. Both posterior cerebral arteries originate from the basilar tip. There is moderate narrowing of the proximal P2 segments bilaterally. Distal PCA branch vessels are intact with distal irregularity. Venous sinuses: Dural sinuses are patent. Straight sinus deep cerebral veins are intact. Cortical veins are within normal limits. Anatomic variants: None Delayed phase:  Postcontrast images demonstrate no pathologic enhancement. 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 Review of the MIP images confirms the above findings CT Brain Perfusion Findings: CBF (<30%) Volume: 6mL Perfusion (Tmax>6.0s) volume: 87mL Mismatch Volume: 81mL Infarction Location:Left MCA territory IMPRESSION: 1. 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 2. The area of reduced cerebral blood flow on the CT perfusion corresponds to a remote infarct and is likely artifactual. The area of acute ischemia on the MRI is not demonstrated on the CT perfusion exam. 3. Large area of ischemic penumbra involving the residual left MCA territory, likely secondary to the high-grade stenoses proximally. 4. 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 5. Tortuosity of the cervical internal carotid arteries bilaterally without significant stenosis. 6. 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 Electronically Signed   By: Marin Roberts M.D.   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 Weakness beginning 2 weeks ago. Recent admission for stroke. Episodes of shaking and absence staring. EXAM: CT ANGIOGRAPHY HEAD AND NECK CT PERFUSION BRAIN TECHNIQUE: Multidetector CT imaging of the head and neck was performed using the standard protocol during bolus administration of intravenous contrast. Multiplanar CT image reconstructions and MIPs were obtained to evaluate the vascular anatomy. Carotid stenosis measurements (when applicable) are obtained utilizing NASCET criteria, using the distal internal carotid diameter as the denominator. Multiphase CT imaging of the brain was performed following IV bolus contrast injection. Subsequent parametric perfusion maps were calculated using RAPID  software. CONTRAST:  ISOVUE-370 IOPAMIDOL (ISOVUE-370) INJECTION 76% COMPARISON:  MRI brain 08/13/2018 at 4:37 p.m. FINDINGS: CT HEAD FINDINGS Brain: Remote left ACA territory infarct is noted. Additional areas of remote left MCA territory infarcts are present. The areas of more acute infarct are less well delineated on noncontrast CT of the head. The right hemisphere is unremarkable. The brainstem and cerebellum are normal. Vascular: No hyperdense vessel or unexpected calcification. Skull: Calvarium is intact. No focal lytic or blastic lesions are present. Sinuses/Orbits: The paranasal sinuses mastoid air cells are clear. Globes and orbits are within normal limits. ASPECTS (Alberta Stroke Program Early CT Score) - Ganglionic level infarction (caudate, lentiform nuclei, internal capsule, insula, M1-M3 cortex): 7/7 - Supraganglionic infarction (M4-M6 cortex): 3/3 Total score (0-10 with 10 being normal): 10/10 Review of the MIP images confirms the above findings CTA NECK FINDINGS Aortic arch: A three-vessel arch configuration is present. There is no significant calcification at the aortic arch or stenosis. Right carotid system: Right common carotid artery is within normal  limits. Right ICA bifurcation scratched at the right carotid bifurcation is normal. There is tortuosity of the distal right ICA without significant stenosis. Left carotid system: The left common carotid artery is within normal limits. Bifurcation is unremarkable. There is moderate tortuosity of the distal cervical ICA without significant stenosis. Vertebral arteries: The vertebral arteries originate from the subclavian arteries bilaterally. There is a high-grade stenosis of the left vertebral artery at its origin. The right vertebral artery is the dominant vessel. No other focal stenosis or vascular injury is present. Skeleton: Vertebral body heights alignment are maintained. 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 No focal lytic or blastic lesions are present. Hyperostosis is noted. Other neck: Subcentimeter nodules are present within the right lobe of the thyroid. No dominant lesion is present. No follow-up is necessary. No significant cervical adenopathy is present. Salivary glands are within normal limits. No focal mucosal lesions are present. Upper chest: There is dependent atelectasis at the lung apices. Mild interstitial coarsening suggests mild edema. Review of the MIP images confirms the above findings CTA HEAD FINDINGS Anterior circulation: Atherosclerotic changes are present within the cavernous internal carotid arteries bilaterally. The lumen of the right ICA is narrowed to 1.5 mm. This compares to more distal vessel of 3 mm. There is a high-grade stenosis of the paraclinoid left ICA. 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 This compares with 2.6 mm at the bifurcation. More mild atherosclerotic changes are present in the right M1 segment. Right MCA bifurcation is intact. Left MCA bifurcation is intact. There is some attenuation of distal MCA branch vessels. Chronic left ACA occlusion is noted. Posterior circulation: The right vertebral artery is the dominant vessel. PICA origins are visualized and normal. Vertebrobasilar junction is normal. Basilar artery is within normal limits. Both posterior cerebral arteries originate from the basilar tip. There is moderate narrowing of the proximal P2 segments bilaterally. Distal PCA branch vessels are intact with distal irregularity. Venous sinuses: Dural sinuses are patent. Straight sinus deep cerebral veins are intact. Cortical veins are within normal limits. Anatomic variants: None Delayed phase: Postcontrast images demonstrate no pathologic enhancement. 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 Review of the MIP images confirms the above findings CT Brain Perfusion Findings: CBF  (<30%) Volume: 6mL Perfusion (Tmax>6.0s) volume: 87mL Mismatch Volume: 81mL Infarction Location:Left MCA territory IMPRESSION: 1. 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 2. The area of reduced cerebral blood flow on the CT perfusion corresponds to a remote infarct and is likely artifactual. The area of acute ischemia on the MRI is not demonstrated on the CT perfusion exam. 3. Large area of ischemic penumbra involving the residual left MCA territory, likely secondary to the high-grade stenoses proximally. 4. 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 5. Tortuosity of the cervical internal carotid arteries bilaterally without significant stenosis. 6. 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 Electronically Signed   By: Marin Roberts M.D.   On: 08/13/2018 19:29   Mr Brain Wo Contrast (neuro Protocol)  Result Date: 08/13/2018 CLINICAL DATA:  Seizure like episodes for 2 weeks. History of stroke, hypertension and diabetes. EXAM: MRI HEAD WITHOUT CONTRAST TECHNIQUE: Multiplanar, multiecho pulse sequences of the brain and surrounding structures were obtained without intravenous contrast. COMPARISON:  None. FINDINGS: Mild motion degraded examination. INTRACRANIAL CONTENTS: Patchy LEFT parietal reduced diffusion  with nearly normalized ADC values and bright FLAIR signal. Spurious reduced diffusion mesial LEFT frontoparietal lobes associated with confluent encephalomalacia and susceptibility artifact. Ex vacuo dilatation LEFT lateral ventricle. Patchy LEFT parietooccipital lobe encephalomalacia. Smaller T2 bright LEFT cerebral peduncle and patchy LEFT pontine T2 hyperintense signal consistent with wallerian degeneration. Patchy supratentorial white matter FLAIR T2 hyperintensities. No susceptibility artifact to suggest hemorrhage. The ventricles and sulci are normal for patient's age. No suspicious  parenchymal signal, masses, mass effect. No abnormal extra-axial fluid collections. No extra-axial masses. VASCULAR: Normal major intracranial vascular flow voids present at skull base. SKULL AND UPPER CERVICAL SPINE: No abnormal sellar expansion. No suspicious calvarial bone marrow signal. Craniocervical junction maintained. SINUSES/ORBITS: The mastoid air-cells and included paranasal sinuses are well-aerated.The included ocular globes and orbital contents are non-suspicious. OTHER: None. IMPRESSION: 1. Mild motion degraded examination. Acute to subacute LEFT parietal/MCA territory infarct. 2. 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 3. Mild-to-moderate chronic small vessel ischemic changes. Electronically Signed   By: Awilda Metro M.D.   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 Weakness beginning 2 weeks ago. Recent admission for stroke. Episodes of shaking and absence staring. EXAM: CT ANGIOGRAPHY HEAD AND NECK CT PERFUSION BRAIN TECHNIQUE: Multidetector CT imaging of the head and neck was performed using the standard protocol during bolus administration of intravenous contrast. Multiplanar CT image reconstructions and MIPs were obtained to evaluate the vascular anatomy. Carotid stenosis measurements (when applicable) are obtained utilizing NASCET criteria, using the distal internal carotid diameter as the denominator. Multiphase CT imaging of the brain was performed following IV bolus contrast injection. Subsequent parametric perfusion maps were calculated using RAPID software. CONTRAST:  ISOVUE-370 IOPAMIDOL (ISOVUE-370) INJECTION 76% COMPARISON:  MRI brain 08/13/2018 at 4:37 p.m. FINDINGS: CT HEAD FINDINGS Brain: Remote left ACA territory infarct is noted. Additional areas of remote left MCA territory infarcts are present. The areas of more acute infarct are  less well delineated on noncontrast CT of the head. The right hemisphere is unremarkable. The brainstem and cerebellum are normal. Vascular: No hyperdense vessel or unexpected calcification. Skull: Calvarium is intact. No focal lytic or blastic lesions are present. Sinuses/Orbits: The paranasal sinuses mastoid air cells are clear. Globes and orbits are within normal limits. ASPECTS (Alberta Stroke Program Early CT Score) - Ganglionic level infarction (caudate, lentiform nuclei, internal capsule, insula, M1-M3 cortex): 7/7 - Supraganglionic infarction (M4-M6 cortex): 3/3 Total score (0-10 with 10 being normal): 10/10 Review of the MIP images confirms the above findings CTA NECK FINDINGS Aortic arch: A three-vessel arch configuration is present. There is no significant calcification at the aortic arch or stenosis. Right carotid system: Right common carotid artery is within normal limits. Right ICA bifurcation scratched at the right carotid bifurcation is normal. There is tortuosity of the distal right ICA without significant stenosis. Left carotid system: The left common carotid artery is within normal limits. Bifurcation is unremarkable. There is moderate tortuosity of the distal cervical ICA without significant stenosis. Vertebral arteries: The vertebral arteries originate from the subclavian arteries bilaterally. There is a high-grade stenosis of the left vertebral artery at its origin. The right vertebral artery is the dominant vessel. No other focal stenosis or vascular injury is present. Skeleton: Vertebral body heights alignment are maintained. 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 No focal lytic or blastic lesions are present. Hyperostosis is noted. Other  neck: Subcentimeter nodules are present within the right lobe of the thyroid. No dominant lesion is present. No follow-up is necessary. No significant cervical adenopathy is present. Salivary glands are within normal  limits. No focal mucosal lesions are present. Upper chest: There is dependent atelectasis at the lung apices. Mild interstitial coarsening suggests mild edema. Review of the MIP images confirms the above findings CTA HEAD FINDINGS Anterior circulation: Atherosclerotic changes are present within the cavernous internal carotid arteries bilaterally. The lumen of the right ICA is narrowed to 1.5 mm. This compares to more distal vessel of 3 mm. There is a high-grade stenosis of the paraclinoid left ICA. 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 This compares with 2.6 mm at the bifurcation. More mild atherosclerotic changes are present in the right M1 segment. Right MCA bifurcation is intact. Left MCA bifurcation is intact. There is some attenuation of distal MCA branch vessels. Chronic left ACA occlusion is noted. Posterior circulation: The right vertebral artery is the dominant vessel. PICA origins are visualized and normal. Vertebrobasilar junction is normal. Basilar artery is within normal limits. Both posterior cerebral arteries originate from the basilar tip. There is moderate narrowing of the proximal P2 segments bilaterally. Distal PCA branch vessels are intact with distal irregularity. Venous sinuses: Dural sinuses are patent. Straight sinus deep cerebral veins are intact. Cortical veins are within normal limits. Anatomic variants: None Delayed phase: Postcontrast images demonstrate no pathologic enhancement. 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 Review of the MIP images confirms the above findings CT Brain Perfusion Findings: CBF (<30%) Volume: 6mL Perfusion (Tmax>6.0s) volume: 87mL Mismatch Volume: 81mL Infarction Location:Left MCA territory IMPRESSION: 1. 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 2. The area of reduced cerebral blood flow on the CT perfusion corresponds to a remote  infarct and is likely artifactual. The area of acute ischemia on the MRI is not demonstrated on the CT perfusion exam. 3. Large area of ischemic penumbra involving the residual left MCA territory, likely secondary to the high-grade stenoses proximally. 4. 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 5. Tortuosity of the cervical internal carotid arteries bilaterally without significant stenosis. 6. 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 Electronically Signed   By: Marin Roberts M.D.   On: 08/13/2018 19:29   Ir Angio Intra Extracran Sel Com Carotid Innominate Bilat Mod Sed  Result Date: 08/18/2018 CLINICAL DATA:  Aphasia and right-sided weakness. EXAM: IR ANGIO VERTEBRAL SEL SUBCLAVIAN INNOMINATE UNI LEFT MOD SED; IR ANGIO VERTEBRAL SEL VERTEBRAL UNI RIGHT MOD SED; BILATERAL COMMON CAROTID AND INNOMINATE ANGIOGRAPHY COMPARISON:  MRI of the head of 08/13/2018, and MRA of the brain of 08/13/2018. MEDICATIONS: Heparin 1000 units IV; no antibiotic was administered within 1 hour of the procedure. ANESTHESIA/SEDATION: Versed 1 mg IV; Fentanyl 25 mcg IV Moderate Sedation Time:  25 minutes The patient was continuously monitored during the procedure by the interventional radiology nurse under my direct supervision. CONTRAST:  Isovue 300 approximately 60 cc FLUOROSCOPY TIME:  Fluoroscopy Time: 7 minutes 0 seconds (1279 mGy). COMPLICATIONS: None immediate. TECHNIQUE: Informed written consent was obtained from the patient after a thorough discussion of the procedural risks, benefits and alternatives. All questions were addressed. Maximal Sterile Barrier Technique was utilized including caps, mask, sterile gowns, sterile gloves, sterile drape, hand hygiene and skin antiseptic. A timeout was performed prior to  the initiation of the procedure. The right groin was prepped and draped in the usual sterile fashion. Thereafter using modified Seldinger technique,  transfemoral access into the right common femoral artery was obtained without difficulty. Over a 0.035 inch guidewire, a 5 French Pinnacle sheath was inserted. Through this, and also over 0.035 inch guidewire, a 5 [place] JB 1 catheter was advanced to the aortic arch region and selectively positioned in the right common carotid artery, the right vertebral artery, the left common carotid artery and the left vertebral artery. FINDINGS: The right common carotid arteriogram demonstrates the right external carotid artery and its major branches to be widely patent. The right internal carotid artery at the bulb to the cranial 2/3 is widely patent. There is a U-shaped tortuosity with mild fusiform dilatation of the distal right internal carotid cervical segment. The petrous segment is widely patent. There is mild to moderate stenosis of the proximal cavernous segment. The distal cavernous and the supraclinoid segments are widely patent. The right middle cerebral artery and the right anterior cerebral artery opacify into the capillary and venous phases. The right vertebral artery origin is widely patent. The vessel opacifies to the cranial skull base. Wide patency is seen of the right vertebrobasilar junction and the right posterior-inferior cerebellar artery. The basilar artery, the posterior cerebral arteries, the superior cerebellar arteries and the anterior-inferior cerebellar arteries is normal into the capillary and venous phases. There is a stump of the left vertebrobasilar junction with no clearance of contrast at this site. The left common carotid arteriogram demonstrates the left external carotid artery and its major branches to be widely patent. The left common carotid arteriogram demonstrates the left external carotid artery and its major branches to be widely patent. The left internal carotid artery at the bulb to the cranial skull base demonstrates wide patency in its proximal 2/3. The distal 1/3 demonstrates  moderate tortuosity. Distal to this the left internal carotid artery assumes normal caliber. The petrous segment is widely patent. There is approximately 50% stenosis of the petrous cavernous junction, and also the proximal supraclinoid left ICA. Distal to this the left internal carotid artery terminus appears widely patent. There is complete occlusion of the left anterior cerebral artery and then the distal A1 segment. The left middle cerebral artery demonstrates a segmental severe focal irregular narrowing with patency of the left MCA trifurcation branches. Attenuated caliber of the MCA trifurcation branches is noted especially of the anterior perisylvian branches. There is no significant reconstitution of the anterior cerebral artery distribution from the perisylvian branches. The origin of the left vertebral artery has a severe 90% plus narrowing. The vessel is seen to opacify to the cranial skull base. Patency is seen of the left vertebrobasilar junction and the left posterior-inferior cerebellar artery. Also demonstrated is flow into the distal left vertebrobasilar junction and also the proximal basilar artery. 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 50% stenosis of the left ICA petrous cavernous junction, and the proximal supraclinoid segment. 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 PLAN: Discussed findings with the patient and the patient's neurologist. Option of endovascular revascularization of the left middle cerebral artery M1 segment discussed. Final decision pending. Electronically Signed   By: Julieanne Cotton M.D.   On: 08/17/2018 08:45   Ir Angio Vertebral Sel Vertebral Bilat Mod Sed  Result Date: 08/18/2018 CLINICAL DATA:  Aphasia and  right-sided weakness. EXAM: IR ANGIO VERTEBRAL SEL SUBCLAVIAN INNOMINATE UNI LEFT MOD SED; IR ANGIO  VERTEBRAL SEL VERTEBRAL UNI RIGHT MOD SED; BILATERAL COMMON CAROTID AND INNOMINATE ANGIOGRAPHY COMPARISON:  MRI of the head of 08/13/2018, and MRA of the brain of 08/13/2018. MEDICATIONS: Heparin 1000 units IV; no antibiotic was administered within 1 hour of the procedure. ANESTHESIA/SEDATION: Versed 1 mg IV; Fentanyl 25 mcg IV Moderate Sedation Time:  25 minutes The patient was continuously monitored during the procedure by the interventional radiology nurse under my direct supervision. CONTRAST:  Isovue 300 approximately 60 cc FLUOROSCOPY TIME:  Fluoroscopy Time: 7 minutes 0 seconds (1279 mGy). COMPLICATIONS: None immediate. TECHNIQUE: Informed written consent was obtained from the patient after a thorough discussion of the procedural risks, benefits and alternatives. All questions were addressed. Maximal Sterile Barrier Technique was utilized including caps, mask, sterile gowns, sterile gloves, sterile drape, hand hygiene and skin antiseptic. A timeout was performed prior to the initiation of the procedure. The right groin was prepped and draped in the usual sterile fashion. Thereafter using modified Seldinger technique, transfemoral access into the right common femoral artery was obtained without difficulty. Over a 0.035 inch guidewire, a 5 French Pinnacle sheath was inserted. Through this, and also over 0.035 inch guidewire, a 5 [place] JB 1 catheter was advanced to the aortic arch region and selectively positioned in the right common carotid artery, the right vertebral artery, the left common carotid artery and the left vertebral artery. FINDINGS: The right common carotid arteriogram demonstrates the right external carotid artery and its major branches to be widely patent. The right internal carotid artery at the bulb to the cranial 2/3 is widely patent. There is a U-shaped tortuosity with mild fusiform dilatation of the distal right internal carotid cervical segment. The petrous segment is widely patent. There  is mild to moderate stenosis of the proximal cavernous segment. The distal cavernous and the supraclinoid segments are widely patent. The right middle cerebral artery and the right anterior cerebral artery opacify into the capillary and venous phases. The right vertebral artery origin is widely patent. The vessel opacifies to the cranial skull base. Wide patency is seen of the right vertebrobasilar junction and the right posterior-inferior cerebellar artery. The basilar artery, the posterior cerebral arteries, the superior cerebellar arteries and the anterior-inferior cerebellar arteries is normal into the capillary and venous phases. There is a stump of the left vertebrobasilar junction with no clearance of contrast at this site. The left common carotid arteriogram demonstrates the left external carotid artery and its major branches to be widely patent. The left common carotid arteriogram demonstrates the left external carotid artery and its major branches to be widely patent. The left internal carotid artery at the bulb to the cranial skull base demonstrates wide patency in its proximal 2/3. The distal 1/3 demonstrates moderate tortuosity. Distal to this the left internal carotid artery assumes normal caliber. The petrous segment is widely patent. There is approximately 50% stenosis of the petrous cavernous junction, and also the proximal supraclinoid left ICA. Distal to this the left internal carotid artery terminus appears widely patent. There is complete occlusion of the left anterior cerebral artery and then the distal A1 segment. The left middle cerebral artery demonstrates a segmental severe focal irregular narrowing with patency of the left MCA trifurcation branches. Attenuated caliber of the MCA trifurcation branches is noted especially of the anterior perisylvian branches. There is no significant reconstitution of the anterior cerebral artery distribution from the perisylvian branches. The origin  of the  left vertebral artery has a severe 90% plus narrowing. The vessel is seen to opacify to the cranial skull base. Patency is seen of the left vertebrobasilar junction and the left posterior-inferior cerebellar artery. Also demonstrated is flow into the distal left vertebrobasilar junction and also the proximal basilar artery. 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 50% stenosis of the left ICA petrous cavernous junction, and the proximal supraclinoid segment. 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 PLAN: Discussed findings with the patient and the patient's neurologist. Option of endovascular revascularization of the left middle cerebral artery M1 segment discussed. Final decision pending. Electronically Signed   By: Julieanne Cotton M.D.   On: 08/17/2018 08:45    Assessment: 47 y.o. female with history of previous strokes, hypertension and diabetes admitted in 07/2018 for complex partial seizure in the setting of new small left MCA infarct.  DSA at that time showed left M1 high-grade stenosis and planned to have left MCA angioplasty versus stenting to be done electively this week.  Patient had procedure today with Dr. Corliss Skains and only angioplasty performed without requirement of stenting.  Plan: - ICU care post intracranial angioplasty - Continue heparin IV for 24 hours post procedure - Continue aspirin 81 and Brilinta for stroke prevention - BP goal <140 within 24 hour post procedure - on home BP meds including losartan - Continue Lipitor and Zetia for hyperlipidemia - continue keppra 500mg  bid for seizure control - close Neuro checks and vascular checks - will follow  Thank you for this consultation and allowing [place] to participate in the care of this patient.  Marvel Plan, MD PhD Stroke Neurology 08/26/2018 6:13 PM

## 2018-08-26 NOTE — Progress Notes (Signed)
Upon receiving patient from PACU, groin site assessed with nurse. Lateral portion of groin hard to touch. Nurse stated that's how the site has been since arriving from PACU. After bleeding incident area soft to touch. VPAD appiled at 1427 under pressure dressing. Will continue to monitor. Dicie BeamFrazier, Jimeka Balan RN BSN.

## 2018-08-26 NOTE — Procedures (Signed)
S/P  Lt common carotid arteriogram followed by balloon angiopl;asty of Lt MCA with patency of approx 90%

## 2018-08-26 NOTE — H&P (Addendum)
Chief Complaint: Patient was seen in consultation today for left MCA stenosis.  Referring Physician(s): Rinehuls, Kinnie Scalesavid L  Supervising Physician: Julieanne Cottoneveshwar, Sanjeev  [facility]  History of Present Illness: Julia Ferguson is a 47 y.o. 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 On 08/07/2018, [facility] for recurrent [facility]. She was discharged home 08/08/2018. She presented to [facility] ED 08/13/2018 after being referred by her PCP for symptoms of right arm jerking/weakness, absent staring, and transient worsening of speech. Upon evaluation, she was found to have multiple areas of intracranial stenosis, and she was then [facility] and further evaluation. She underwent an image-guided diagnostic cerebral angiogram 08/15/2018 by Dr. Corliss Skainseveshwar which revealed left MCA stenosis. She was discharged home 08/17/2018 in stable condition.  Patient presents today for possible image-guided cerebral angiogram with possible left MCA stenosis angioplasty/stent placement. Patient awake and alert laying in bed. Complains of right-sided weakness/numbness/tingling, stable since discharge 08/17/2018. Complains of speech difficulty, states she has noticed slight improvement since discharge 08/17/2018. Denies fever, chills, chest pain, dyspnea, abdominal pain, headache, dizziness, vision changes, hearing changes, or tinnitus.  Patient is currently taking Brilinta 90 mg twice daily and Aspirin 81 mg once daily.   Past Medical History:  Diagnosis Date  . Chronic kidney disease   . Diabetes mellitus without complication (HCC)   . Hypertension   . Right sided weakness    post - stroke  . Stroke [facility](HCC)     Past Surgical History:  Procedure Laterality Date  . ABDOMINAL HYSTERECTOMY    . CESAREAN SECTION    . EYE SURGERY     laser   . IR ANGIO INTRA EXTRACRAN SEL COM CAROTID  INNOMINATE BILAT MOD SED  08/15/2018  . IR ANGIO VERTEBRAL SEL VERTEBRAL BILAT MOD SED  08/15/2018    Allergies: Patient has no known allergies.  Medications: Prior to Admission medications   Medication Sig Start Date End Date Taking? Authorizing Provider  aspirin EC 325 MG EC tablet Take 1 tablet (325 mg total) by mouth daily. Patient not taking: Reported on 08/19/2018 08/17/18   Leroy SeaSingh, Prashant K, MD  aspirin EC 81 MG tablet Take 81 mg by mouth daily.    [provider]  atorvastatin (LIPITOR) 80 MG tablet Take 80 mg by mouth every evening.     [provider]  carvedilol (COREG) 6.25 MG tablet Take 1 tablet (6.25 mg total) by mouth 2 (two) times daily with a meal. 08/17/18   Leroy SeaSingh, Prashant K, MD  ezetimibe (ZETIA) 10 MG tablet Take 10 mg by mouth daily.  08/12/18   [provider]  gabapentin (NEURONTIN) 600 MG tablet Take 600 mg by mouth at bedtime.     [provider]  insulin aspart (NOVOLOG) 100 UNIT/ML injection Substitute to any brand approved. . Before each meal 3 times a day, 140-199 - 2 units, 200-250 - 4 units, 251-299 - 6 units,  300-349 - 8 units,  350 or above 10 units. Dispense syringes and needles as needed, Ok to switch to PEN if approved. DX DM2, Code E11.65 08/17/18   Leroy SeaSingh, Prashant K, MD  Insulin Glargine (BASAGLAR KWIKPEN) 100 UNIT/ML SOPN Inject 45 Units into the skin daily.     [provider]  Insulin Syringe-Needle U-100 25G X 1" 1 ML MISC For 4 times a day insulin SQ, 1 month supply. Diagnosis E11.65 08/17/18   Susa RaringSingh, Prashant  K, MD  losartan (COZAAR) 100 MG tablet Take 50 mg by mouth daily.    [provider]  niacin 100 MG tablet Take 1 tablet (100 mg total) by mouth 3 (three) times daily with meals. 08/17/18   Leroy Sea, MD  pantoprazole (PROTONIX) 40 MG tablet Take 1 tablet (40 mg total) by mouth daily. 08/17/18   Leroy Sea, MD  ticagrelor (BRILINTA) 60 MG TABS tablet Take 1.5 tablets (90 mg  total) by mouth 2 (two) times daily. 08/17/18   Leroy Sea, MD  traMADol (ULTRAM) 50 MG tablet Take 50 mg by mouth daily as needed for moderate pain.     [provider]     Family History  Problem Relation Age of Onset  . Hypertension Other   . Kidney failure Other   . Hypertension Mother   . Diabetes Mother   . Colon cancer Father     Social History   Socioeconomic History  . Marital status: Divorced    Spouse name: Not on file  . Number of children: Not on file  . Years of education: Not on file  . Highest education level: Not on file  Occupational History  . Not on file  Social Needs  . Financial resource strain: Not on file  . Food insecurity:    Worry: Not on file    Inability: Not on file  . Transportation needs:    Medical: Not on file    Non-medical: Not on file  Tobacco Use  . Smoking status: Never Smoker  . Smokeless tobacco: Never Used  Substance and Sexual Activity  . Alcohol use: Never    Frequency: Never  . Drug use: Never  . Sexual activity: Not on file  Lifestyle  . Physical activity:    Days per week: Not on file    Minutes per session: Not on file  . Stress: Not on file  Relationships  . Social connections:    Talks on phone: Not on file    Gets together: Not on file    Attends religious service: Not on file    Active member of club or organization: Not on file    Attends meetings of clubs or organizations: Not on file    Relationship status: Not on file  Other Topics Concern  . Not on file  Social History Narrative  . Not on file     Review of Systems: A 12 point ROS discussed and pertinent positives are indicated in the HPI above.  All other systems are negative.  Review of Systems  Constitutional: Negative for chills and fever.  HENT: Negative for hearing loss and tinnitus.   Eyes: Negative for visual disturbance.  Respiratory: Negative for shortness of breath and wheezing.   Cardiovascular: Negative for chest  pain and palpitations.  Gastrointestinal: Negative for abdominal pain.  Neurological: Positive for speech difficulty, weakness and numbness. Negative for dizziness and headaches.  Psychiatric/Behavioral: Negative for behavioral problems and confusion.    Vital Signs: There were no vitals taken for this visit.  Physical Exam  Constitutional: She appears well-developed and well-nourished. No distress.  Cardiovascular: Normal rate, regular rhythm and normal heart sounds.  No murmur heard. Pulmonary/Chest: Effort normal and breath sounds normal. No respiratory distress. She has no wheezes.  Neurological:  Awake and alert but does not know month, year, or city we are in. Demonstrates difficulty word finding. No facial asymmetry. Tongue midline. Right plantar flexion 3-4/5, right dorsiflexion 4/5. No  pronator drift. Decreased fine motor movements of fingers of right hand.  Skin: Skin is warm and dry.  Psychiatric: She has a normal mood and affect. Her behavior is normal. Judgment and thought content normal.  Nursing note and vitals reviewed.    MD Evaluation Airway: WNL Heart: WNL Abdomen: WNL Chest/ Lungs: WNL ASA  Classification: 2 Mallampati/Airway Score: Two   Imaging: Ct Angio Head W Or Wo Contrast  Result Date: 08/13/2018 CLINICAL DATA:  Arterial stricture/occlusion of the head and neck. Weakness beginning 2 weeks ago. Recent admission for stroke. Episodes of shaking and absence staring. EXAM: CT ANGIOGRAPHY HEAD AND NECK CT PERFUSION BRAIN TECHNIQUE: Multidetector CT imaging of the head and neck was performed using the standard protocol during bolus administration of intravenous contrast. Multiplanar CT image reconstructions and MIPs were obtained to evaluate the vascular anatomy. Carotid stenosis measurements (when applicable) are obtained utilizing NASCET criteria, using the distal internal carotid diameter as the denominator. Multiphase CT imaging of the brain was  performed following IV bolus contrast injection. Subsequent parametric perfusion maps were calculated using RAPID software. CONTRAST:  ISOVUE-370 IOPAMIDOL (ISOVUE-370) INJECTION 76% COMPARISON:  MRI brain 08/13/2018 at 4:37 p.m. FINDINGS: CT HEAD FINDINGS Brain: Remote left ACA territory infarct is noted. Additional areas of remote left MCA territory infarcts are present. The areas of more acute infarct are less well delineated on noncontrast CT of the head. The right hemisphere is unremarkable. The brainstem and cerebellum are normal. Vascular: No hyperdense vessel or unexpected calcification. Skull: Calvarium is intact. No focal lytic or blastic lesions are present. Sinuses/Orbits: The paranasal sinuses mastoid air cells are clear. Globes and orbits are within normal limits. ASPECTS (Alberta Stroke Program Early CT Score) - Ganglionic level infarction (caudate, lentiform nuclei, internal capsule, insula, M1-M3 cortex): 7/7 - Supraganglionic infarction (M4-M6 cortex): 3/3 Total score (0-10 with 10 being normal): 10/10 Review of the MIP images confirms the above findings CTA NECK FINDINGS Aortic arch: A three-vessel arch configuration is present. There is no significant calcification at the aortic arch or stenosis. Right carotid system: Right common carotid artery is within normal limits. Right ICA bifurcation scratched at the right carotid bifurcation is normal. There is tortuosity of the distal right ICA without significant stenosis. Left carotid system: The left common carotid artery is within normal limits. Bifurcation is unremarkable. There is moderate tortuosity of the distal cervical ICA without significant stenosis. Vertebral arteries: The vertebral arteries originate from the subclavian arteries bilaterally. There is a high-grade stenosis of the left vertebral artery at its origin. The right vertebral artery is the dominant vessel. No other focal stenosis or vascular injury is present. Skeleton:  Vertebral body heights alignment are maintained. 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 No focal lytic or blastic lesions are present. Hyperostosis is noted. Other neck: Subcentimeter nodules are present within the right lobe of the thyroid. No dominant lesion is present. No follow-up is necessary. No significant cervical adenopathy is present. Salivary glands are within normal limits. No focal mucosal lesions are present. Upper chest: There is dependent atelectasis at the lung apices. Mild interstitial coarsening suggests mild edema. Review of the MIP images confirms the above findings CTA HEAD FINDINGS Anterior circulation: Atherosclerotic changes are present within the cavernous internal carotid arteries bilaterally. The lumen of the right ICA is narrowed to 1.5 mm. This compares to more distal vessel of 3 mm. There is a high-grade stenosis of the paraclinoid left ICA. There is severe stenosis of the mid  left basilar artery measuring less than 1 mm. This compares with 2.6 mm at the bifurcation. More mild atherosclerotic changes are present in the right M1 segment. Right MCA bifurcation is intact. Left MCA bifurcation is intact. There is some attenuation of distal MCA branch vessels. Chronic left ACA occlusion is noted. Posterior circulation: The right vertebral artery is the dominant vessel. PICA origins are visualized and normal. Vertebrobasilar junction is normal. Basilar artery is within normal limits. Both posterior cerebral arteries originate from the basilar tip. There is moderate narrowing of the proximal P2 segments bilaterally. Distal PCA branch vessels are intact with distal irregularity. Venous sinuses: Dural sinuses are patent. Straight sinus deep cerebral veins are intact. Cortical veins are within normal limits. Anatomic variants: None Delayed phase: Postcontrast images demonstrate no pathologic enhancement. There is extension wasted of the areas of previous  left ACA MCA infarction. Review of the MIP images confirms the above findings CT Brain Perfusion Findings: CBF (<30%) Volume: 6mL Perfusion (Tmax>6.0s) volume: 87mL Mismatch Volume: 81mL Infarction Location:Left MCA territory IMPRESSION: 1. 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 2. The area of reduced cerebral blood flow on the CT perfusion corresponds to a remote infarct and is likely artifactual. The area of acute ischemia on the MRI is not demonstrated on the CT perfusion exam. 3. Large area of ischemic penumbra involving the residual left MCA territory, likely secondary to the high-grade stenoses proximally. 4. 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 5. Tortuosity of the cervical internal carotid arteries bilaterally without significant stenosis. 6. 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 Electronically Signed   By: Marin Roberts M.D.   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 Weakness beginning 2 weeks ago. Recent admission for stroke. Episodes of shaking and absence staring. EXAM: CT ANGIOGRAPHY HEAD AND NECK CT PERFUSION BRAIN TECHNIQUE: Multidetector CT imaging of the head and neck was performed using the standard protocol during bolus administration of intravenous contrast. Multiplanar CT image reconstructions and MIPs were obtained to evaluate the vascular anatomy. Carotid stenosis measurements (when applicable) are obtained utilizing NASCET criteria, using the distal internal carotid diameter as the denominator. Multiphase CT imaging of the brain was performed following IV bolus contrast injection. Subsequent parametric perfusion maps were calculated using RAPID software. CONTRAST:  ISOVUE-370 IOPAMIDOL (ISOVUE-370) INJECTION 76% COMPARISON:  MRI brain 08/13/2018  at 4:37 p.m. FINDINGS: CT HEAD FINDINGS Brain: Remote left ACA territory infarct is noted. Additional areas of remote left MCA territory infarcts are present. The areas of more acute infarct are less well delineated on noncontrast CT of the head. The right hemisphere is unremarkable. The brainstem and cerebellum are normal. Vascular: No hyperdense vessel or unexpected calcification. Skull: Calvarium is intact. No focal lytic or blastic lesions are present. Sinuses/Orbits: The paranasal sinuses mastoid air cells are clear. Globes and orbits are within normal limits. ASPECTS (Alberta Stroke Program Early CT Score) - Ganglionic level infarction (caudate, lentiform nuclei, internal capsule, insula, M1-M3 cortex): 7/7 - Supraganglionic infarction (M4-M6 cortex): 3/3 Total score (0-10 with 10 being normal): 10/10 Review of the MIP images confirms the above findings CTA NECK FINDINGS Aortic arch: A three-vessel arch configuration is present. There is no significant calcification at the aortic arch or stenosis. Right carotid system: Right common carotid artery is within normal limits. Right ICA  bifurcation scratched at the right carotid bifurcation is normal. There is tortuosity of the distal right ICA without significant stenosis. Left carotid system: The left common carotid artery is within normal limits. Bifurcation is unremarkable. There is moderate tortuosity of the distal cervical ICA without significant stenosis. Vertebral arteries: The vertebral arteries originate from the subclavian arteries bilaterally. There is a high-grade stenosis of the left vertebral artery at its origin. The right vertebral artery is the dominant vessel. No other focal stenosis or vascular injury is present. Skeleton: Vertebral body heights alignment are maintained. 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 No focal lytic or blastic lesions are present. Hyperostosis is noted. Other neck: Subcentimeter  nodules are present within the right lobe of the thyroid. No dominant lesion is present. No follow-up is necessary. No significant cervical adenopathy is present. Salivary glands are within normal limits. No focal mucosal lesions are present. Upper chest: There is dependent atelectasis at the lung apices. Mild interstitial coarsening suggests mild edema. Review of the MIP images confirms the above findings CTA HEAD FINDINGS Anterior circulation: Atherosclerotic changes are present within the cavernous internal carotid arteries bilaterally. The lumen of the right ICA is narrowed to 1.5 mm. This compares to more distal vessel of 3 mm. There is a high-grade stenosis of the paraclinoid left ICA. 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 This compares with 2.6 mm at the bifurcation. More mild atherosclerotic changes are present in the right M1 segment. Right MCA bifurcation is intact. Left MCA bifurcation is intact. There is some attenuation of distal MCA branch vessels. Chronic left ACA occlusion is noted. Posterior circulation: The right vertebral artery is the dominant vessel. PICA origins are visualized and normal. Vertebrobasilar junction is normal. Basilar artery is within normal limits. Both posterior cerebral arteries originate from the basilar tip. There is moderate narrowing of the proximal P2 segments bilaterally. Distal PCA branch vessels are intact with distal irregularity. Venous sinuses: Dural sinuses are patent. Straight sinus deep cerebral veins are intact. Cortical veins are within normal limits. Anatomic variants: None Delayed phase: Postcontrast images demonstrate no pathologic enhancement. 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 Review of the MIP images confirms the above findings CT Brain Perfusion Findings: CBF (<30%) Volume: 6mL Perfusion (Tmax>6.0s) volume: 87mL Mismatch Volume: 81mL Infarction Location:Left MCA territory  IMPRESSION: 1. 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 2. The area of reduced cerebral blood flow on the CT perfusion corresponds to a remote infarct and is likely artifactual. The area of acute ischemia on the MRI is not demonstrated on the CT perfusion exam. 3. Large area of ischemic penumbra involving the residual left MCA territory, likely secondary to the high-grade stenoses proximally. 4. 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 5. Tortuosity of the cervical internal carotid arteries bilaterally without significant stenosis. 6. 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 Electronically Signed   By: Marin Roberts M.D.   On: 08/13/2018 19:29   Mr Brain Wo Contrast (neuro Protocol)  Result Date: 08/13/2018 CLINICAL DATA:  Seizure like episodes for 2 weeks. History of stroke, hypertension and diabetes. EXAM: MRI HEAD WITHOUT CONTRAST TECHNIQUE: Multiplanar, multiecho pulse sequences of the brain and surrounding structures were obtained without intravenous contrast. COMPARISON:  None. FINDINGS: Mild motion degraded examination. INTRACRANIAL CONTENTS: Patchy LEFT parietal reduced diffusion with nearly normalized  ADC values and bright FLAIR signal. Spurious reduced diffusion mesial LEFT frontoparietal lobes associated with confluent encephalomalacia and susceptibility artifact. Ex vacuo dilatation LEFT lateral ventricle. Patchy LEFT parietooccipital lobe encephalomalacia. Smaller T2 bright LEFT cerebral peduncle and patchy LEFT pontine T2 hyperintense signal consistent with wallerian degeneration. Patchy supratentorial white matter FLAIR T2 hyperintensities. No susceptibility artifact to suggest hemorrhage. The ventricles and sulci are normal for patient's age. No suspicious parenchymal signal, masses, mass effect. No abnormal extra-axial fluid collections. No extra-axial masses. VASCULAR:  Normal major intracranial vascular flow voids present at skull base. SKULL AND UPPER CERVICAL SPINE: No abnormal sellar expansion. No suspicious calvarial bone marrow signal. Craniocervical junction maintained. SINUSES/ORBITS: The mastoid air-cells and included paranasal sinuses are well-aerated.The included ocular globes and orbital contents are non-suspicious. OTHER: None. IMPRESSION: 1. Mild motion degraded examination. Acute to subacute LEFT parietal/MCA territory infarct. 2. 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 3. Mild-to-moderate chronic small vessel ischemic changes. Electronically Signed   By: Awilda Metro M.D.   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 Weakness beginning 2 weeks ago. Recent admission for stroke. Episodes of shaking and absence staring. EXAM: CT ANGIOGRAPHY HEAD AND NECK CT PERFUSION BRAIN TECHNIQUE: Multidetector CT imaging of the head and neck was performed using the standard protocol during bolus administration of intravenous contrast. Multiplanar CT image reconstructions and MIPs were obtained to evaluate the vascular anatomy. Carotid stenosis measurements (when applicable) are obtained utilizing NASCET criteria, using the distal internal carotid diameter as the denominator. Multiphase CT imaging of the brain was performed following IV bolus contrast injection. Subsequent parametric perfusion maps were calculated using RAPID software. CONTRAST:  ISOVUE-370 IOPAMIDOL (ISOVUE-370) INJECTION 76% COMPARISON:  MRI brain 08/13/2018 at 4:37 p.m. FINDINGS: CT HEAD FINDINGS Brain: Remote left ACA territory infarct is noted. Additional areas of remote left MCA territory infarcts are present. The areas of more acute infarct are less well delineated on noncontrast CT of the head. The right hemisphere is unremarkable. The brainstem and cerebellum  are normal. Vascular: No hyperdense vessel or unexpected calcification. Skull: Calvarium is intact. No focal lytic or blastic lesions are present. Sinuses/Orbits: The paranasal sinuses mastoid air cells are clear. Globes and orbits are within normal limits. ASPECTS (Alberta Stroke Program Early CT Score) - Ganglionic level infarction (caudate, lentiform nuclei, internal capsule, insula, M1-M3 cortex): 7/7 - Supraganglionic infarction (M4-M6 cortex): 3/3 Total score (0-10 with 10 being normal): 10/10 Review of the MIP images confirms the above findings CTA NECK FINDINGS Aortic arch: A three-vessel arch configuration is present. There is no significant calcification at the aortic arch or stenosis. Right carotid system: Right common carotid artery is within normal limits. Right ICA bifurcation scratched at the right carotid bifurcation is normal. There is tortuosity of the distal right ICA without significant stenosis. Left carotid system: The left common carotid artery is within normal limits. Bifurcation is unremarkable. There is moderate tortuosity of the distal cervical ICA without significant stenosis. Vertebral arteries: The vertebral arteries originate from the subclavian arteries bilaterally. There is a high-grade stenosis of the left vertebral artery at its origin. The right vertebral artery is the dominant vessel. No other focal stenosis or vascular injury is present. Skeleton: Vertebral body heights alignment are maintained. 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 No focal lytic or blastic lesions are present. Hyperostosis is noted. Other neck: Subcentimeter nodules  are present within the right lobe of the thyroid. No dominant lesion is present. No follow-up is necessary. No significant cervical adenopathy is present. Salivary glands are within normal limits. No focal mucosal lesions are present. Upper chest: There is dependent atelectasis at the lung apices. Mild  interstitial coarsening suggests mild edema. Review of the MIP images confirms the above findings CTA HEAD FINDINGS Anterior circulation: Atherosclerotic changes are present within the cavernous internal carotid arteries bilaterally. The lumen of the right ICA is narrowed to 1.5 mm. This compares to more distal vessel of 3 mm. There is a high-grade stenosis of the paraclinoid left ICA. 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 This compares with 2.6 mm at the bifurcation. More mild atherosclerotic changes are present in the right M1 segment. Right MCA bifurcation is intact. Left MCA bifurcation is intact. There is some attenuation of distal MCA branch vessels. Chronic left ACA occlusion is noted. Posterior circulation: The right vertebral artery is the dominant vessel. PICA origins are visualized and normal. Vertebrobasilar junction is normal. Basilar artery is within normal limits. Both posterior cerebral arteries originate from the basilar tip. There is moderate narrowing of the proximal P2 segments bilaterally. Distal PCA branch vessels are intact with distal irregularity. Venous sinuses: Dural sinuses are patent. Straight sinus deep cerebral veins are intact. Cortical veins are within normal limits. Anatomic variants: None Delayed phase: Postcontrast images demonstrate no pathologic enhancement. 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 Review of the MIP images confirms the above findings CT Brain Perfusion Findings: CBF (<30%) Volume: 6mL Perfusion (Tmax>6.0s) volume: 87mL Mismatch Volume: 81mL Infarction Location:Left MCA territory IMPRESSION: 1. 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 2. The area of reduced cerebral blood flow on the CT perfusion corresponds to a remote infarct and is likely artifactual. The area of acute ischemia on the MRI is not demonstrated on the CT perfusion  exam. 3. Large area of ischemic penumbra involving the residual left MCA territory, likely secondary to the high-grade stenoses proximally. 4. 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 5. Tortuosity of the cervical internal carotid arteries bilaterally without significant stenosis. 6. 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 Electronically Signed   By: Marin Roberts M.D.   On: 08/13/2018 19:29   Ir Angio Intra Extracran Sel Com Carotid Innominate Bilat Mod Sed  Result Date: 08/18/2018 CLINICAL DATA:  Aphasia and right-sided weakness. EXAM: IR ANGIO VERTEBRAL SEL SUBCLAVIAN INNOMINATE UNI LEFT MOD SED; IR ANGIO VERTEBRAL SEL VERTEBRAL UNI RIGHT MOD SED; BILATERAL COMMON CAROTID AND INNOMINATE ANGIOGRAPHY COMPARISON:  MRI of the head of 08/13/2018, and MRA of the brain of 08/13/2018. MEDICATIONS: Heparin 1000 units IV; no antibiotic was administered within 1 hour of the procedure. ANESTHESIA/SEDATION: Versed 1 mg IV; Fentanyl 25 mcg IV Moderate Sedation Time:  25 minutes The patient was continuously monitored during the procedure by the interventional radiology nurse under my direct supervision. CONTRAST:  Isovue 300 approximately 60 cc FLUOROSCOPY TIME:  Fluoroscopy Time: 7 minutes 0 seconds (1279 mGy). COMPLICATIONS: None immediate. TECHNIQUE: Informed written consent was obtained from the patient after a thorough discussion of the procedural risks, benefits and alternatives. All questions were addressed. Maximal Sterile Barrier Technique was utilized including caps, mask, sterile gowns, sterile gloves, sterile drape, hand hygiene and skin antiseptic. A timeout was performed prior to the initiation of  the procedure. The right groin was prepped and draped in the usual sterile fashion. Thereafter using modified Seldinger technique, transfemoral access into the right common femoral artery was obtained without difficulty. Over a 0.035 inch  guidewire, a 5 French Pinnacle sheath was inserted. Through this, and also over 0.035 inch guidewire, a 5 [place] JB 1 catheter was advanced to the aortic arch region and selectively positioned in the right common carotid artery, the right vertebral artery, the left common carotid artery and the left vertebral artery. FINDINGS: The right common carotid arteriogram demonstrates the right external carotid artery and its major branches to be widely patent. The right internal carotid artery at the bulb to the cranial 2/3 is widely patent. There is a U-shaped tortuosity with mild fusiform dilatation of the distal right internal carotid cervical segment. The petrous segment is widely patent. There is mild to moderate stenosis of the proximal cavernous segment. The distal cavernous and the supraclinoid segments are widely patent. The right middle cerebral artery and the right anterior cerebral artery opacify into the capillary and venous phases. The right vertebral artery origin is widely patent. The vessel opacifies to the cranial skull base. Wide patency is seen of the right vertebrobasilar junction and the right posterior-inferior cerebellar artery. The basilar artery, the posterior cerebral arteries, the superior cerebellar arteries and the anterior-inferior cerebellar arteries is normal into the capillary and venous phases. There is a stump of the left vertebrobasilar junction with no clearance of contrast at this site. The left common carotid arteriogram demonstrates the left external carotid artery and its major branches to be widely patent. The left common carotid arteriogram demonstrates the left external carotid artery and its major branches to be widely patent. The left internal carotid artery at the bulb to the cranial skull base demonstrates wide patency in its proximal 2/3. The distal 1/3 demonstrates moderate tortuosity. Distal to this the left internal carotid artery assumes normal caliber. The petrous  segment is widely patent. There is approximately 50% stenosis of the petrous cavernous junction, and also the proximal supraclinoid left ICA. Distal to this the left internal carotid artery terminus appears widely patent. There is complete occlusion of the left anterior cerebral artery and then the distal A1 segment. The left middle cerebral artery demonstrates a segmental severe focal irregular narrowing with patency of the left MCA trifurcation branches. Attenuated caliber of the MCA trifurcation branches is noted especially of the anterior perisylvian branches. There is no significant reconstitution of the anterior cerebral artery distribution from the perisylvian branches. The origin of the left vertebral artery has a severe 90% plus narrowing. The vessel is seen to opacify to the cranial skull base. Patency is seen of the left vertebrobasilar junction and the left posterior-inferior cerebellar artery. Also demonstrated is flow into the distal left vertebrobasilar junction and also the proximal basilar artery. 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 50% stenosis of the left ICA petrous cavernous junction, and the proximal supraclinoid segment. 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 PLAN: Discussed findings with the patient and the patient's neurologist. Option of endovascular revascularization of the left middle cerebral artery M1 segment discussed. Final decision pending. Electronically Signed   By: Julieanne Cotton M.D.   On: 08/17/2018 08:45   Ir Angio Vertebral Sel Vertebral Bilat Mod Sed  Result Date: 08/18/2018 CLINICAL DATA:  Aphasia and right-sided weakness. EXAM:  IR ANGIO VERTEBRAL SEL SUBCLAVIAN INNOMINATE UNI LEFT MOD SED; IR ANGIO VERTEBRAL SEL VERTEBRAL UNI RIGHT MOD SED; BILATERAL COMMON CAROTID AND INNOMINATE ANGIOGRAPHY COMPARISON:   MRI of the head of 08/13/2018, and MRA of the brain of 08/13/2018. MEDICATIONS: Heparin 1000 units IV; no antibiotic was administered within 1 hour of the procedure. ANESTHESIA/SEDATION: Versed 1 mg IV; Fentanyl 25 mcg IV Moderate Sedation Time:  25 minutes The patient was continuously monitored during the procedure by the interventional radiology nurse under my direct supervision. CONTRAST:  Isovue 300 approximately 60 cc FLUOROSCOPY TIME:  Fluoroscopy Time: 7 minutes 0 seconds (1279 mGy). COMPLICATIONS: None immediate. TECHNIQUE: Informed written consent was obtained from the patient after a thorough discussion of the procedural risks, benefits and alternatives. All questions were addressed. Maximal Sterile Barrier Technique was utilized including caps, mask, sterile gowns, sterile gloves, sterile drape, hand hygiene and skin antiseptic. A timeout was performed prior to the initiation of the procedure. The right groin was prepped and draped in the usual sterile fashion. Thereafter using modified Seldinger technique, transfemoral access into the right common femoral artery was obtained without difficulty. Over a 0.035 inch guidewire, a 5 French Pinnacle sheath was inserted. Through this, and also over 0.035 inch guidewire, a 5 [place] JB 1 catheter was advanced to the aortic arch region and selectively positioned in the right common carotid artery, the right vertebral artery, the left common carotid artery and the left vertebral artery. FINDINGS: The right common carotid arteriogram demonstrates the right external carotid artery and its major branches to be widely patent. The right internal carotid artery at the bulb to the cranial 2/3 is widely patent. There is a U-shaped tortuosity with mild fusiform dilatation of the distal right internal carotid cervical segment. The petrous segment is widely patent. There is mild to moderate stenosis of the proximal cavernous segment. The distal cavernous and the supraclinoid  segments are widely patent. The right middle cerebral artery and the right anterior cerebral artery opacify into the capillary and venous phases. The right vertebral artery origin is widely patent. The vessel opacifies to the cranial skull base. Wide patency is seen of the right vertebrobasilar junction and the right posterior-inferior cerebellar artery. The basilar artery, the posterior cerebral arteries, the superior cerebellar arteries and the anterior-inferior cerebellar arteries is normal into the capillary and venous phases. There is a stump of the left vertebrobasilar junction with no clearance of contrast at this site. The left common carotid arteriogram demonstrates the left external carotid artery and its major branches to be widely patent. The left common carotid arteriogram demonstrates the left external carotid artery and its major branches to be widely patent. The left internal carotid artery at the bulb to the cranial skull base demonstrates wide patency in its proximal 2/3. The distal 1/3 demonstrates moderate tortuosity. Distal to this the left internal carotid artery assumes normal caliber. The petrous segment is widely patent. There is approximately 50% stenosis of the petrous cavernous junction, and also the proximal supraclinoid left ICA. Distal to this the left internal carotid artery terminus appears widely patent. There is complete occlusion of the left anterior cerebral artery and then the distal A1 segment. The left middle cerebral artery demonstrates a segmental severe focal irregular narrowing with patency of the left MCA trifurcation branches. Attenuated caliber of the MCA trifurcation branches is noted especially of the anterior perisylvian branches. There is no significant reconstitution of the anterior cerebral artery distribution from the perisylvian branches. The origin of the left  vertebral artery has a severe 90% plus narrowing. The vessel is seen to opacify to the cranial skull  base. Patency is seen of the left vertebrobasilar junction and the left posterior-inferior cerebellar artery. Also demonstrated is flow into the distal left vertebrobasilar junction and also the proximal basilar artery. 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 50% stenosis of the left ICA petrous cavernous junction, and the proximal supraclinoid segment. 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 PLAN: Discussed findings with the patient and the patient's neurologist. Option of endovascular revascularization of the left middle cerebral artery M1 segment discussed. Final decision pending. Electronically Signed   By: Julieanne Cotton M.D.   On: 08/17/2018 08:45    Labs:  CBC: Recent Labs    08/13/18 1722 08/13/18 1731 08/15/18 0453 08/16/18 0421  WBC 7.7  --  7.7 7.4  HGB 11.4* 12.6 10.7* 10.3*  HCT 36.0 37.0 33.4* 33.5*  PLT 386  --  352 351    COAGS: Recent Labs    08/13/18 1722 08/26/18 0644  INR 0.97 0.99  APTT 28 29    BMP: Recent Labs    08/13/18 1722 08/13/18 1731 08/15/18 0453 08/16/18 0421  NA 140 140 137 136  K 3.6 3.7 3.4* 3.6  CL 106 106 103 106  CO2 25  --  24 21*  GLUCOSE 78 75 131* 123*  BUN 14 14 13 14   CALCIUM 9.5  --  9.3 9.0  CREATININE 1.24* 1.30* 1.41* 1.22*  GFRNONAA 50*  --  43* 51*  GFRAA 58*  --  50* 59*    LIVER FUNCTION TESTS: Recent Labs    08/13/18 1722  BILITOT 1.0  AST 16  ALT 19  ALKPHOS 63  PROT 9.0*  ALBUMIN 4.4    TUMOR MARKERS: No results for input(s): AFPTM, CEA, CA199, CHROMGRNA in the last 8760 hours.  Assessment and Plan:  Left MCA stenosis. Plan for image-guided cerebral angiogram with possible left MCA stenosis angioplasty/stent placement. Patient is NPO. Afebrile. INR 0.99 seconds this AM. P2Y12 32 PRU this AM- ok to proceed per Dr. Corliss Skains. Will  administer home BP medications (Carvedilol, Losartan) for BP control.  Risks and benefits of cerebral angiogram with intervention were discussed with the patient including, but not limited to bleeding, infection, vascular injury, contrast induced renal failure, stroke or even death. This interventional procedure involves the use of X-rays and because of the nature of the planned procedure, it is possible that we will have prolonged use of X-ray fluoroscopy. Potential radiation risks to you include (but are not limited to) the following: - A slightly elevated risk for cancer  several years later in life. This risk is typically less than 0.5% percent. This risk is low in comparison to the normal incidence of human cancer, which is 33% for women and 50% for men according to the [employer]. - Radiation induced injury can include skin redness, resembling a rash, tissue breakdown / ulcers and hair loss (which can be temporary or permanent).  The likelihood of either of these occurring depends on the difficulty of the procedure and whether you are sensitive to radiation due to previous procedures, disease, or genetic conditions.  IF your procedure requires a prolonged use of radiation, you will be notified and given written instructions for further action.  It is your responsibility to monitor the irradiated area  for the 2 weeks following the procedure and to notify your physician if you are concerned that you have suffered a radiation induced injury.   All of the patient's questions were answered, patient is agreeable to proceed. Consent signed and in chart.   Thank you for this interesting consult.  I greatly enjoyed meeting Julia Ferguson and look forward to participating in their care.  A copy of this report was sent to the requesting provider on this date.  Electronically Signed: Elwin Mocha, PA-C 08/26/2018, 8:21 AM   I spent a total of 40 Minutes in face to face in clinical  consultation, greater than 50% of which was counseling/coordinating care for left MCA stenosis.

## 2018-08-26 NOTE — Progress Notes (Signed)
Patient ID: Julia Ferguson, female   DOB: 04/06/1971, 47 y.o.   MRN: [id number] INR Patient extubated. Maintaing PaO2 > 95 %. Denies H/As,N/V,visual or new motor symptoms. Obeys simple commands appropriately. Pupils 40mmHarringtoZ6262Johnell [facility] HospitaloMon000111Samuel Bouch22SherrieKorea Share657788Rivka 65Ba46EarlAzucena Kuba121Karie SchwCrG56PPrinceEAdva61n915-86(85[facility] [phone]-0KaZ686Johnell [facility] BalaoMon000111Samuel Bouch80SherrieK[place] Share657768Rivka 31Ba81EarlAzucena Kuba12Karie SchwCrG74PPrinceEAdva69n931-6686Physicians Surgical CenteConnect75738m-1LZ6379Johnell [facility] HospitaloMon000111Samuel Bouch45SherrieK[place] Share657198Rivka 1Ba79EarlAzucena Kuba122Karie SchwCrG32PPrinceEAdva65n573471[facility] [phone] [facility] Surgery CenteroMon000111Samuel Bouch75SherrieK[place] Share657118Rivka 60Ba39EarlAzucena Kuba168Karie SchwCrG65PPrinceEAdva53n(413) 5543[facility] [phone] [facility] HospitaloMon000111Samuel Bouch63SherrieKorea Share65738Rivka 41Ba85EarlAzucena Kuba196Karie SchwCrG30PPrinceEAdva18n579-1430[facility] HospitaConnect(340) 5973m0-BirchZ65Johnell [facility] HospitaloMon000111Samuel Bouch67SherrieKorea Share657248Rivka 63Ba68EarlAzucena Kuba139Karie SchwCrG35PPrinceEAdva36n207-4892[facility] SouthwesConnect(617) 114-8m0EFaiZ65110Johnell [facility] SpringsoMon000111Samuel Bouch26SherrieKorea Share657258Rivka 25Ba38EarlAzucena Kuba176Karie SchwCrG11PPrinceEAdva7n(617) 05(67[facility] [phone] [facility] HospitaloMon000111Samuel Bouch40SherrieK[place] Share657588Rivka 64Ba109EarlAzucena Kuba18Karie SchwCrG63PPrinceEAdva69n(919)44(67[facility] FargConnect5120147m82Harbor Z33Johnell [facility] LLCoMon000111Samuel Bouch(219SherrieKorea Share657688Rivka 51Ba62EarlAzucena Kuba140Karie SchwCrG12PPrinceEAdva46n725-7081[facility] [phone] [facility] CtroMon000111Samuel Bouch28SherrieKorea Share657258Rivka 70Ba75EarlAzucena Kuba176Karie SchwCrG44PPrinceEAdva44n431-1364[facility] [phone]. 51mGeSnoZ644Johnell [facility] LLCoMon000111Samuel Bouch(43SherrieKorea Share657368Rivka 34Ba74EarlAzucena Kuba146Karie SchwCrG64PPrinceEAdva32n567372[facility] HospitaConnect([phone] [facility] LLCoMon000111Samuel Bouch90SherrieK[place] Share657708Rivka 28Ba14EarlAzucena Kuba148Karie SchwCrG64PPrinceEAdva3n443-10(45[facility] HospitaConnect([phone]nell [facility] HospitaloMon000111Samuel Bouch(55SherrieK[place] Share657518Rivka 23Ba40EarlAzucena Kuba117Karie SchwCrG46PPrinceEAdva26n(802)2330[facility] CenteConnect67883Johnell [facility]000111Samuel Bouch86SherrieKorea Share6545743EarlAzucena Ku60G7PPrinceEAdva81n431 33534-514-7E65mldZ642Johnell [facility] CenteroMon000111Samuel Bouch95SherrieK[place] Share65748Rivka 34Ba64EarlAzucena Kuba146Karie SchwCrG70PPrinceEAdva34n575 8420[facility] General HospitaConnect6071713El35mdSLaZ6563Johnell [facility] CenteroMon000111Samuel BouchSherrieKorea Share65768Rivka 8Ba62EarlAzucena Kuba180Karie SchwCrG79PPrinceEAdva32n(681)6048[facility] HospitaConnect318-601-1EldS66munGZ6358Johnell [facility] FlorenceoMon000111Samuel Bouch55SherrieK[place] Share657698Rivka 8Ba52EarlAzucena Kuba170Karie SchwCrG29PPrinceEAdva104n430-8193[facility] - [phone] [facility] CenteroMon000111Samuel Bouch33SherrieK[place] Share65728Rivka 15Ba3EarlAzucena Kuba181Karie SchwCrG86PPrinceEAdva65n2033(25[facility] InConnect226-508-24m8ECollinZ6266Johnell [facility] And Medical CentersoMon000111Samuel Bouch(805SherrieK[place] Share65718Rivka 26Ba31EarlAzucena Kuba12Karie SchwCrG55PPrinceEAdva6n204-2981[facility] [phone] [facility] LLCoMon000111Samuel Bouch50SherrieK[place] Share657778Rivka 88Ba5EarlAzucena Kuba133Karie SchwCrG52PPrinceEAdva14n702751[facility]osp - Mental Health CenteConnect78741m-Z6247Johnell [facility] RosebudoMon000111Samuel Bouch(85SherrieK[place] Share657618Rivka 68Ba29EarlAzucena Kuba130Karie SchwCrG36PPrinceEAdva91n(209) 5985[facility] Medical CenteConnect773-7m28Bailey's PZ617Johnell [facility] HospitaloMon000111Samuel Bouch(817SherrieKorea Share657458Rivka 74Ba30EarlAzucena Kuba110Karie SchwCrG56PPrinceEAdva58n(762)58(904[facility] HospitaConnect534-5960m7-YadkiZ615Johnell [facility] HospitaloMon000111Samuel Bouch(77SherrieKorea Share657368Rivka 58Ba42EarlAzucena Kuba135Karie SchwCrG74PPrinceEAdva24n737-2720[facility], IncConnect805-50m44AllZ6570Johnell [facility] CenteroMon000111Samuel Bouch(35SherrieKorea Share657998Rivka 56Ba14EarlAzucena Kuba153Karie SchwCrG20PPrinceEAdva37n409160[facility] McduffiConnect([phone])[phone] Z685Johnell [facility] CampusoMon000111Samuel Bouch63SherrieKorea Share657528Rivka 63Ba92EarlAzucena Kuba1102Karie SchwCrG50PPrinceEAdva23n915-2366[place] [facility] Coast [phone] [facility] SystemoMon000111Samuel Bouch(80SherrieK[place] Share657808Rivka 28Ba65EarlAzucena Kuba179Karie SchwCrG69PPrinceEAdva87n417-79(61[facility] CenteConnect([phone] [facility] Medical CenteroMon000111Samuel Bouch(28SherrieKorea Share657398Rivka 86Ba39EarlAzucena Kuba123Karie SchwCrG14PPrinceEAdva21n(253) 0286[facility] Special[facility] HospitaConnect279 459m53WarZ6242Johnell [facility] In ManhattanoMon000111Samuel Bouch62SherrieKorea Share657728Rivka 42Ba90EarlAzucena Kuba131Karie SchwCrG81PPrinceEAdva38n(640)[phone] [phone] [facility] HospitaloMon000111Samuel Bouch38SherrieKorea Share657588Rivka 42Ba49EarlAzucena Kuba128Karie SchwCrG27PPrinceEAdva9n248-2485[facility] Medical CenteConnect479-64m80HeZ613Johnell [facility] HospitaloMon000111Samuel Bouch27SherrieKorea Share657578Rivka 25Ba40EarlAzucena Kuba137Karie SchwCrG45PPrinceEAdva67n203-11(838[facility] SysteConnect936-775m2-BZ65Johnell [facility] LLCoMon000111Samuel Bouch77SherrieKorea Share65738Rivka 9Ba19EarlAzucena Kuba167Karie SchwCrG24PPrinceEAdva11n907-56(30[facility] SysteConnect561 7152m2 TrophZ628Johnell [facility] CtroMon000111Samuel BouchSherrieKorea Share657488Rivka 33Ba65EarlAzucena Kuba167Karie SchwCrG19PPrinceEAdva70n509-7994[facility] HospitaConnect([phone] [phone] [facility] LLCoMon000111Samuel Bouch25SherrieKorea Share657368Rivka 58Ba61EarlAzucena Kuba133Karie SchwCrG40PPrinceEAdva64n814-1792[facility] [phone] Z6472Johnell [facility] LLCoMon000111Samuel BouchSherrieKorea Share657568Rivka 50Ba29EarlAzucena Kuba139Karie SchwCrG65PPrinceEAdva33n419-2033The Endoscopy Center Of FairfielConnect573 1944m 0PeaZ6439Johnell Surg[facility] Of [facility] MarylandoMon000111Samuel BouchSherrieKorea Share657478Rivka 32Ba57EarlAzucena Kuba149Karie SchwCrG27PPrinceEAdva64n279 42(262[facility] SouthlakConnect940 7766m 2SZ16Johnell Anthony M Yelencsics CommunityoMon000111Samuel Bouch70SherrieKorea Share657638Rivka 103Ba57EarlAzucena Kuba141Karie SchwCrG3PPrinceEAdva75n4070Longleaf Surgery CenteConnect248873m16WillZ654Johnell Saint ALPhonsus Medical Center - OntariooMon000111Samuel BouchSherrieKorea Share65728Rivka 58Ba67EarlAzucena Kuba189Karie SchwCrG12PPrinceEAdva68n925-55[facility] LLConnect816 497 6El59mdCDoZ6471Johnell The [facility] - NorthwestoMon000111Samuel Bouch70SherrieKorea Share657648Rivka 3Ba64EarlAzucena Kuba155Karie SchwCrG79PPrinceEAdva32n873-3958[facility] HospitaConnect([phone])076-3EldWilk19minWillisZ6774Johnell [facility] At Ft LoganoMon000111Samuel BouchSherrieKorea Share657238Rivka 67Ba43EarlAzucena Kuba184Karie SchwCrG1PPrinceEAdva50n443 22(50[facility] AugustConnect716-840mZ61Johnell [facility] HospitaloMon000111Samuel Bouch(48SherrieKorea Share657868Rivka 63Ba35EarlAzucena Kuba13Karie SchwCrG85PPrinceEAdva31n450-3877[facility] & SwingbeConnect56745m-Z6348Johnell [facility] CenteroMon000111Samuel Bouch81SherrieKorea Share657108Rivka 68Ba36EarlAzucena Kuba142Karie SchwCrG36PPrinceEAdva71n831-6080[facility] LLConnect315-92m50RoZ98Johnell [facility] HospitaloMon000111Samuel Bouch51SherrieKorea Share657108Rivka 62Ba66EarlAzucena Kuba147Karie SchwCrG14PPrinceEAdva54n914-3561[facility] [phone] [facility] WilliamsonoMon000111Samuel Bouch(36SherrieKorea Share657538Rivka 7Ba34EarlAzucena Kuba115Karie SchwCrG95PPrinceEAdva75n(681)73(77[facility] [facility] HospitaConnect541 414 9E64mlZ6718Johnell Center For Urolog[facility] SurgeryoMon000111Samuel Bouch98SherrieKorea Share657598Rivka 20Ba58EarlAzucena Kuba166Karie SchwCrG50PPrinceEAdva61n512-0143[facility] Surgery CenteConnect(312) 651m78LaZ635Johnell [facility] BirminghamoMon000111Samuel Bouch70SherrieKorea Share657668Rivka 23Ba7EarlAzucena Kuba137Karie SchwCrG50PPrinceEAdva75n640-36(906[facility] HospitaConnect([phone])[phone] [facility] Surgical CenteroMon000111Samuel BouchSherrieKorea Share657778Rivka 72Ba70EarlAzucena Kuba181Karie SchwCrG45PPrinceEAdva8n279 5232[facility] LLConnect925-736m35Lake PanasZ622Johnell [facility] HospitaloMon000111Samuel BouchSherrieKorea Share657298Rivka 31Ba35EarlAzucena Kuba172Karie SchwCrG17PPrinceEAdva59n671-10[facility] 7 LLConnect669 [address]6428[address] [facility] LLCoMon000111Samuel Bouch78SherrieKorea Share657418Rivka 68Ba70EarlAzucena Kuba172Karie SchwCrG76PPrinceEAdva73n938-27(72[facility] [phone] [facility] IncoMon000111Samuel BouchSherrieKorea Share657758Rivka 85Ba27EarlAzucena Kuba187Karie SchwCrG37PPrinceEAdva79n(503) 2741[facility] HospitaConnect949-211-531mElBothelZ6331Johnell [facility] CenteroMon000111Samuel Bouch80SherrieKorea Share65788Rivka 78Ba19EarlAzucena Kuba170Karie SchwCrG37PPrinceEAdva78n(830) 00(234[place] HospitaConnect([phone])5792m-0CentrZ6277Johnell [facility] LLCoMon000111Samuel Bouch(21SherrieKorea Share657318Rivka 56Ba2EarlAzucena Kuba159Karie SchwCrG90PPrinceEAdva78n(262)78(85[facility] LConnect602-1346m4-RiZ21Johnell [facility] WestoMon000111Samuel Bouch40SherrieKorea Share6571028Rivka 53Ba19EarlAzucena Kuba156Karie SchwCrG60PPrinceEAdva18n639-11(61[facility] [phone] [facility] HospitaloMon000111Samuel Bouch(70SherrieKorea Share657148Rivka 9Ba43EarlAzucena Kuba155Karie SchwCrG22PPrinceEAdva51n808 6343V [facility] [facility] D[facility] Behavior[facility] HospitaConnect([phone])250mZ677Johnell [facility] IncoMon000111Samuel Bouch33SherrieKorea Share657178Rivka 93Ba65EarlAzucena Kuba159Karie SchwCrG34PPrinceEAdva97n561-4731[facility] D[facility] Surgery [phone] [facility] Medical CenteroMon000111Samuel Bouch54SherrieKorea Share657768Rivka 11Ba68EarlAzucena Kuba114Karie SchwCrG75PPrinceEAdva17n620-28(248[place] HospitaConnect859-671-9EldNor58mthVZ6742Johnell [facility] CampusoMon000111Samuel Bouch65SherrieKorea Share657378Rivka 7Ba35EarlAzucena Kuba156Karie SchwCrG65PPrinceEAdva76n223-49(80[facility] InConnect(740) 47815m-0Crystal SZ646Johnell [facility] LLCoMon000111Samuel Bouch(90SherrieKorea Share657218Rivka 11Ba35EarlAzucena Kuba125Karie SchwCrG12PPrinceEAdva32n903495[facility]igh[facility] Women'S HospitaConnect334-478-57m5ERZ631Johnell [facility] IncoMon000111Samuel Bouch31SherrieKorea Share657498Rivka 37Ba56EarlAzucena Kuba190Karie SchwCrG48PPrinceEAdva56n985 52(62[facility] Surgery CenteConnect927m80WZ663Johnell [facility] LansingoMon000111Samuel Bouch(80SherrieKorea Share657388Rivka 61Ba38EarlAzucena Kuba161Karie SchwCrG32PPrinceEAdva40n480-8180[facility] AssociateConnect220-422-8E74mldViennZ6883Johnell [facility] CampusoMon000111Samuel Bouch50SherrieKorea Share657548Rivka 81Ba31EarlAzucena Kuba179Karie SchwCrG6PPrinceEAdva87n(954)31[facility] LLConnect323-3842m3-GZ6245Johnell [facility] CampusoMon000111Samuel BouchSherrieKorea Share657668Rivka 86Ba55EarlAzucena Kuba122Karie SchwCrG81PPrinceEAdva31n(281)30(32[facility] [phone]l [facility] ApmcoMon000111Samuel Bouch85SherrieKorea Share657338Rivka 86Ba32EarlAzucena Kuba174Karie SchwCrG97PPrinceEAdva75n423-23Va [facility] SysteConnect3103644m20DeerZ6233Johnell [facility] CenteroMon000111Samuel Bouch(35SherrieKorea Share657918Rivka 9Ba51EarlAzucena Kuba112Karie SchwCrG82PPrinceEAdva106n236-12(902[facility] MilwaukeConnect253 628 66m4EStoZ67Johnell Shepp[facility] HospitaloMon000111Samuel Bouch61SherrieKorea Share657428Rivka 58Ba27EarlAzucena Kuba170Karie SchwCrG28PPrinceEAdva52n250-01[facility] RegioConnect832 460 7E25mldRoZ65Johnell V [facility] D[facility] HospitaloMon000111Samuel Bouch(20SherrieKorea Share657378Rivka 67Ba27EarlAzucena Kuba136Karie SchwCrG23PPrinceEAdva59n714-74(78[facility] InConnect([phone] [facility] MinocquaoMon000111Samuel Bouch66SherrieKorea Share657318Rivka 22Ba48EarlAzucena Kuba142Karie SchwCrG30PPrinceEAdva44n(249)48(954[facility] LouiConnect628-85m28PiZ11Johnell [facility] HospitaloMon000111Samuel Bouch34SherrieKorea Share65738Rivka 54Ba5EarlAzucena Kuba168Karie SchwCrG47PPrinceEAdva1n614-7866[place] HospitaConnect681-1576m6-CZ669Johnell [facility] ServicesoMon000111Samuel Bouch(70SherrieKorea Share657878Rivka 38Ba93EarlAzucena Kuba154Karie SchwCrG65PPrinceEAdva44n320-0335[facility] LLConnect702171m86RobersoZ6557Johnell [facility] Endoscopy CenteroMon000111Samuel Bouch95SherrieKorea Share65778Rivka 49Ba5EarlAzucena Kuba13Karie SchwCrG53PPrinceEAdva36n(947)60(91[facility] CenteConnect380 44679m 4WashZ6439Johnell [facility] W[facility] HospitaloMon000111Samuel Bouch92SherrieKorea Share657758Rivka 6Ba8EarlAzucena Kuba148Karie SchwCrG3PPrinceEAdva69n731-3432[facility] [phone] Puebloamsesial asymmetry.Tongue midline. Able to raise both UEs and LEs L>R. Dorsi and Plantar flexion still weak and unchanged. Lt groin soft No hematoma. Pulses palpable DPs and PTs unchanged.  S.Vitaly Wanat MD

## 2018-08-26 NOTE — Anesthesia Procedure Notes (Addendum)
Procedure Name: Intubation Date/Time: 08/26/2018 9:18 AM Performed by: Marena ChancyBeckner, Consuelo Suthers S, CRNA Pre-anesthesia Checklist: Patient identified, Emergency Drugs available, Suction available and Patient being monitored Patient Re-evaluated:Patient Re-evaluated prior to induction Oxygen Delivery Method: Circle System Utilized Preoxygenation: Pre-oxygenation with 100% oxygen Induction Type: IV induction Ventilation: Mask ventilation without difficulty Laryngoscope Size: Glidescope and 4 Grade View: Grade II Tube type: Oral Number of attempts: 1 Airway Equipment and Method: Stylet and Oral airway Placement Confirmation: ETT inserted through vocal cords under direct vision,  positive ETCO2 and breath sounds checked- equal and bilateral Tube secured with: Tape Dental Injury: Teeth and Oropharynx as per pre-operative assessment  Comments: GlideScope Go

## 2018-08-26 NOTE — Progress Notes (Signed)
This nurse went into assess groin site with night nurseat 1920. Site bleeding, pressure held immediately, heparin gtt turned off. Deveshwar notified with new orders.  Hemostasis achieved at 1946. Vpad placed along with pressure dressing. Site assessed with night nurse. Dicie BeamFrazier, Shameca Landen RN BSN.

## 2018-08-26 NOTE — Progress Notes (Signed)
Upon admitting patient and removing linen, bleeding noted at L femoral IR site both medially and laterally from saturated gauze dressing.  Pressure held at the site immediately starting at 1357, heparin stopped at 1359, Dr. Corliss Skainseveshwar and PA called with instructions to hold pressure for 20 minutes, keep Heparin off, apply Vpad and pressure dressing.    No changes in BP or HR noted throughout aforementioned events.

## 2018-08-26 NOTE — Progress Notes (Addendum)
ANTICOAGULATION CONSULT NOTE - Initial Consult  Pharmacy Consult:  Heparin Indication:  S/p cerebral angiogram with stenting  No Known Allergies  Patient Measurements:   Heparin Dosing Weight: 70kg  Vital Signs: Temp: 98.1 F (36.7 C) (12/04 1317) Temp Source: Oral (12/04 0635) BP: 139/78 (12/04 1317) Pulse Rate: 93 (12/04 1317)  Labs: Recent Labs    08/26/18 0644 08/26/18 0935  HGB  --  10.5*  HCT  --  33.9*  PLT  --  406*  APTT 29  --   LABPROT 13.0  --   INR 0.99  --   CREATININE  --  1.29*    Estimated Creatinine Clearance: 53.5 mL/min (A) (by C-G formula based on SCr of 1.29 mg/dL (H)).   Medical History: Past Medical History:  Diagnosis Date  . Chronic kidney disease   . Diabetes mellitus without complication (HCC)   . Hypertension   . Right sided weakness    post - stroke  . Stroke [facility](HCC)       Assessment: [age] YOF s/p cerebral angiogram with stenting to start IV heparin post-op.  Per RN, patient was bleeding so heparin initiation is being postponed until 1600.  Goal of Therapy:  Heparin level 0.1 - 0.25 units/ml Monitor platelets by anticoagulation protocol: Yes   Plan:  Start heparin gtt at 500 units/hr as ordered Check 6 hr heparin level Monitor closely bleeding/resolution of bleeding Stop on 08/27/18 at 0800 per protocol   Sarae Nicholes D. Laney Potashang, PharmD, BCPS, BCCCP 08/26/2018, 3:00 PM  =====================================  Addendum: RN informed Pharmacy that patient was bleeding from groin again, so RN got verbal order from MD to hold heparin since 2000 and to restart at half the rate (250 units/hr) now.  Will cancel 6 hr heparin level as expect it to be undetectable and heparin is scheduled to stop at 0800.   Fremon Zacharia D. Laney Potashang, PharmD, BCPS, BCCCP 08/26/2018, 9:45 PM

## 2018-08-26 NOTE — H&P (Deleted)
  The note originally documented on this encounter has been moved the the encounter in which it belongs.  

## 2018-08-26 NOTE — Progress Notes (Signed)
Dr. Corliss Skainseveshwar notified of ACT 180. New orders for heparin received and will carry out. Will continue to monitor. Dicie BeamFrazier, Ashur Glatfelter RN BSN.

## 2018-08-26 NOTE — Transfer of Care (Signed)
Immediate Anesthesia Transfer of Care Note  Patient: Julia Ferguson  Procedure(s) Performed: STENT PLACEMENT RADIOLOGY WITH ANESTHESIA (N/A )  Patient Location: PACU  Anesthesia Type:General  Level of Consciousness: awake, alert  and oriented  Airway & Oxygen Therapy: Patient Spontanous Breathing and Patient connected to face mask oxygen  Post-op Assessment: Report given to RN, Post -op Vital signs reviewed and stable and Patient moving all extremities X 4  Post vital signs: Reviewed and stable  Last Vitals:  Vitals Value Taken Time  BP 140/77 08/26/2018 12:10 PM  Temp 36.3 C 08/26/2018 11:59 AM  Pulse 86 08/26/2018 12:11 PM  Resp 15 08/26/2018 12:11 PM  SpO2 100 % 08/26/2018 12:11 PM  Vitals shown include unvalidated device data.  Last Pain:  Vitals:   08/26/18 0651  TempSrc:   PainSc: 0-No pain      Patients Stated Pain Goal: 0 (08/26/18 [id number])  Complications: No apparent anesthesia complications

## 2018-08-26 NOTE — Anesthesia Procedure Notes (Signed)
Arterial Line Insertion Start/End12/12/2017 8:35 AM, 08/26/2018 8:40 AM Performed by: Adair LaundryPaxton, Lynn A, CRNA, CRNA  Patient location: Pre-op. Preanesthetic checklist: patient identified, IV checked, site marked, risks and benefits discussed, surgical consent, monitors and equipment checked, pre-op evaluation, timeout performed and anesthesia consent Lidocaine 1% used for infiltration Left, radial was placed Catheter size: 20 G Hand hygiene performed  and maximum sterile barriers used   Attempts: 1 Procedure performed without using ultrasound guided technique. Following insertion, dressing applied and Biopatch. Post procedure assessment: normal  Patient tolerated the procedure well with no immediate complications.

## 2018-08-26 NOTE — Progress Notes (Signed)
On handoff report pt noted to have oozing at femoral site. Pressure being held, Deveshwar notified and verbal orders to pause heparin gtt and repeat ACT. Pt to remain flat overnight, 1 Mg of dilaudid IV once and may repeat in 6 hours if needed.   V pad and pressure dressing replaced and currently at a Level 0, WCTM.

## 2018-08-26 NOTE — Sedation Documentation (Signed)
Report given to PACU, RN. Groin site and distal pulses checked. Patient moving all extremities, pt has a history of right side weakness. PACU RN informed of sign/held orders that will need to be released.

## 2018-08-26 NOTE — Anesthesia Postprocedure Evaluation (Signed)
Anesthesia Post Note  Patient: Julia Ferguson  Procedure(s) Performed: STENT PLACEMENT RADIOLOGY WITH ANESTHESIA (N/A )     Patient location during evaluation: PACU Anesthesia Type: General Level of consciousness: awake and alert Pain management: pain level controlled Vital Signs Assessment: post-procedure vital signs reviewed and stable Respiratory status: spontaneous breathing, nonlabored ventilation and respiratory function stable Cardiovascular status: blood pressure returned to baseline and stable Postop Assessment: no apparent nausea or vomiting Anesthetic complications: no    Last Vitals:  Vitals:   08/26/18 1315 08/26/18 1317  BP:  139/78  Pulse: 89 93  Resp: 10 16  Temp:  36.7 C  SpO2: 99% 99%    Last Pain:  Vitals:   08/26/18 1300  TempSrc:   PainSc: 0-No pain                 Lowella CurbWarren Ray Jamylah Marinaccio

## 2018-08-26 NOTE — Progress Notes (Signed)
ACT level 131 called to Deveshwar. Orders to restart Heparin gtt in 1 hour at half the previous rate. Groin site still level 0 and pulses present.

## 2018-08-27 ENCOUNTER — Other Ambulatory Visit: Payer: Self-pay | Admitting: Physician Assistant

## 2018-08-27 ENCOUNTER — Encounter (HOSPITAL_COMMUNITY): Payer: Self-pay | Admitting: Radiology

## 2018-08-27 DIAGNOSIS — I10 Essential (primary) hypertension: Secondary | ICD-10-CM

## 2018-08-27 DIAGNOSIS — I6602 Occlusion and stenosis of left middle cerebral artery: Secondary | ICD-10-CM

## 2018-08-27 LAB — CBC WITH DIFFERENTIAL/PLATELET
Abs Immature Granulocytes: 0.08 10*3/uL — ABNORMAL HIGH (ref 0.00–0.07)
Basophils Absolute: 0 10*3/uL (ref 0.0–0.1)
Basophils Relative: 0 %
Eosinophils Absolute: 0 10*3/uL (ref 0.0–0.5)
Eosinophils Relative: 0 %
HCT: 31.9 % — ABNORMAL LOW (ref 36.0–46.0)
Hemoglobin: 9.8 g/dL — ABNORMAL LOW (ref 12.0–15.0)
Immature Granulocytes: 1 %
Lymphocytes Relative: 9 %
Lymphs Abs: 1.4 10*3/uL (ref 0.7–4.0)
MCH: 26.5 pg (ref 26.0–34.0)
MCHC: 30.7 g/dL (ref 30.0–36.0)
MCV: 86.2 fL (ref 80.0–100.0)
MONO ABS: 1.1 10*3/uL — AB (ref 0.1–1.0)
Monocytes Relative: 7 %
Neutro Abs: 13.2 10*3/uL — ABNORMAL HIGH (ref 1.7–7.7)
Neutrophils Relative %: 83 %
Platelets: 395 10*3/uL (ref 150–400)
RBC: 3.7 MIL/uL — ABNORMAL LOW (ref 3.87–5.11)
RDW: 13.5 % (ref 11.5–15.5)
WBC: 15.8 10*3/uL — ABNORMAL HIGH (ref 4.0–10.5)
nRBC: 0 % (ref 0.0–0.2)

## 2018-08-27 LAB — BASIC METABOLIC PANEL
Anion gap: 13 (ref 5–15)
BUN: 10 mg/dL (ref 6–20)
CO2: 19 mmol/L — ABNORMAL LOW (ref 22–32)
Calcium: 8.3 mg/dL — ABNORMAL LOW (ref 8.9–10.3)
Chloride: 105 mmol/L (ref 98–111)
Creatinine, Ser: 1.08 mg/dL — ABNORMAL HIGH (ref 0.44–1.00)
GFR calc Af Amer: >60 mL/min (ref >60)
Glucose, Bld: 211 mg/dL — ABNORMAL HIGH (ref 70–99)
Potassium: 3.6 mmol/L (ref 3.5–5.1)
Sodium: 137 mmol/L (ref 135–145)

## 2018-08-27 LAB — GLUCOSE, CAPILLARY
Glucose-Capillary: 196 mg/dL — ABNORMAL HIGH (ref 70–99)
Glucose-Capillary: 245 mg/dL — ABNORMAL HIGH (ref 70–99)

## 2018-08-27 LAB — PLATELET INHIBITION P2Y12: Platelet Function  P2Y12: 17 [PRU] — ABNORMAL LOW (ref 194–418)

## 2018-08-27 LAB — POCT ACTIVATED CLOTTING TIME: Activated Clotting Time: 131 seconds

## 2018-08-27 MED ORDER — PANTOPRAZOLE SODIUM 40 MG IV SOLR
40.0000 mg | Freq: Every day | INTRAVENOUS | Status: DC
  Administered 2018-08-27: 40 mg via INTRAVENOUS
  Filled 2018-08-27: qty 40

## 2018-08-27 NOTE — Progress Notes (Signed)
STROKE TEAM PROGRESS NOTE   SUBJECTIVE (INTERVAL HISTORY) Her mom and RN are at the bedside.  Pt sitting in chair, no complains. Heparin IV discontinued. Pt is on ASA and brilinta. Plan to discharge today.    OBJECTIVE Temp:  [98 F (36.7 C)-98.5 F (36.9 C)] 98 F (36.7 C) (12/05 1200) Pulse Rate:  [78-93] 87 (12/05 1215) Cardiac Rhythm: Normal sinus rhythm (12/05 0800) Resp:  [11-20] 20 (12/05 1215) BP: (87-172)/(61-131) 172/101 (12/05 1300) SpO2:  [89 %-97 %] 97 % (12/05 1215)  Recent Labs  Lab 08/26/18 1216 08/26/18 1700 08/26/18 2129 08/27/18 0744 08/27/18 1122  GLUCAP 166* 187* 204* 196* 245*   Recent Labs  Lab 08/26/18 0935 08/27/18 0606  NA 138 137  K 4.0 3.6  CL 104 105  CO2 21* 19*  GLUCOSE 202* 211*  BUN 17 10  CREATININE 1.29* 1.08*  CALCIUM 9.2 8.3*   No results for input(s): AST, ALT, ALKPHOS, BILITOT, PROT, ALBUMIN in the last 168 hours. Recent Labs  Lab 08/26/18 0935 08/27/18 0606  WBC 7.9 15.8*  NEUTROABS  --  13.2*  HGB 10.5* 9.8*  HCT 33.9* 31.9*  MCV 85.0 86.2  PLT 406* 395   No results for input(s): CKTOTAL, CKMB, CKMBINDEX, TROPONINI in the last 168 hours. Recent Labs    08/26/18 0644  LABPROT 13.0  INR 0.99   No results for input(s): COLORURINE, LABSPEC, PHURINE, GLUCOSEU, HGBUR, BILIRUBINUR, KETONESUR, PROTEINUR, UROBILINOGEN, NITRITE, LEUKOCYTESUR in the last 72 hours.  Invalid input(s): APPERANCEUR     Component Value Date/Time   CHOL 138 08/15/2018 0453   TRIG 63 08/15/2018 0453   HDL 42 08/15/2018 0453   CHOLHDL 3.3 08/15/2018 0453   VLDL 13 08/15/2018 0453   LDLCALC 83 08/15/2018 0453   Lab Results  Component Value Date   HGBA1C 6.9 (H) 08/15/2018      Component Value Date/Time   LABOPIA NONE DETECTED 08/14/2018 0829   COCAINSCRNUR NONE DETECTED 08/14/2018 0829   LABBENZ NONE DETECTED 08/14/2018 0829   AMPHETMU NONE DETECTED 08/14/2018 0829   THCU NONE DETECTED 08/14/2018 0829   LABBARB NONE DETECTED  08/14/2018 0829    No results for input(s): ETH in the last 168 hours.  I have personally reviewed the radiological images below and agree with the radiology interpretations.  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 Weakness beginning 2 weeks ago. Recent admission for stroke. Episodes of shaking and absence staring. EXAM: CT ANGIOGRAPHY HEAD AND NECK CT PERFUSION BRAIN TECHNIQUE: Multidetector CT imaging of the head and neck was performed using the standard protocol during bolus administration of intravenous contrast. Multiplanar CT image reconstructions and MIPs were obtained to evaluate the vascular anatomy. Carotid stenosis measurements (when applicable) are obtained utilizing NASCET criteria, using the distal internal carotid diameter as the denominator. Multiphase CT imaging of the brain was performed following IV bolus contrast injection. Subsequent parametric perfusion maps were calculated using RAPID software. CONTRAST:  ISOVUE-370 IOPAMIDOL (ISOVUE-370) INJECTION 76% COMPARISON:  MRI brain 08/13/2018 at 4:37 p.m. FINDINGS: CT HEAD FINDINGS Brain: Remote left ACA territory infarct is noted. Additional areas of remote left MCA territory infarcts are present. The areas of more acute infarct are less well delineated on noncontrast CT of the head. The right hemisphere is unremarkable. The brainstem and cerebellum are normal. Vascular: No hyperdense vessel or unexpected calcification. Skull: Calvarium is intact. No focal lytic or blastic lesions are present.  Sinuses/Orbits: The paranasal sinuses mastoid air cells are clear. Globes and orbits are within normal limits. ASPECTS (Alberta Stroke Program Early CT Score) - Ganglionic level infarction (caudate, lentiform nuclei, internal capsule, insula, M1-M3 cortex): 7/7 - Supraganglionic infarction (M4-M6 cortex): 3/3 Total score (0-10 with 10 being normal): 10/10 Review  of the MIP images confirms the above findings CTA NECK FINDINGS Aortic arch: A three-vessel arch configuration is present. There is no significant calcification at the aortic arch or stenosis. Right carotid system: Right common carotid artery is within normal limits. Right ICA bifurcation scratched at the right carotid bifurcation is normal. There is tortuosity of the distal right ICA without significant stenosis. Left carotid system: The left common carotid artery is within normal limits. Bifurcation is unremarkable. There is moderate tortuosity of the distal cervical ICA without significant stenosis. Vertebral arteries: The vertebral arteries originate from the subclavian arteries bilaterally. There is a high-grade stenosis of the left vertebral artery at its origin. The right vertebral artery is the dominant vessel. No other focal stenosis or vascular injury is present. Skeleton: Vertebral body heights alignment are maintained. 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 No focal lytic or blastic lesions are present. Hyperostosis is noted. Other neck: Subcentimeter nodules are present within the right lobe of the thyroid. No dominant lesion is present. No follow-up is necessary. No significant cervical adenopathy is present. Salivary glands are within normal limits. No focal mucosal lesions are present. Upper chest: There is dependent atelectasis at the lung apices. Mild interstitial coarsening suggests mild edema. Review of the MIP images confirms the above findings CTA HEAD FINDINGS Anterior circulation: Atherosclerotic changes are present within the cavernous internal carotid arteries bilaterally. The lumen of the right ICA is narrowed to 1.5 mm. This compares to more distal vessel of 3 mm. There is a high-grade stenosis of the paraclinoid left ICA. 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 This compares with 2.6 mm at the bifurcation.  More mild atherosclerotic changes are present in the right M1 segment. Right MCA bifurcation is intact. Left MCA bifurcation is intact. There is some attenuation of distal MCA branch vessels. Chronic left ACA occlusion is noted. Posterior circulation: The right vertebral artery is the dominant vessel. PICA origins are visualized and normal. Vertebrobasilar junction is normal. Basilar artery is within normal limits. Both posterior cerebral arteries originate from the basilar tip. There is moderate narrowing of the proximal P2 segments bilaterally. Distal PCA branch vessels are intact with distal irregularity. Venous sinuses: Dural sinuses are patent. Straight sinus deep cerebral veins are intact. Cortical veins are within normal limits. Anatomic variants: None Delayed phase: Postcontrast images demonstrate no pathologic enhancement. 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 Review of the MIP images confirms the above findings CT Brain Perfusion Findings: CBF (<30%) Volume: 6mL Perfusion (Tmax>6.0s) volume: 87mL Mismatch Volume: 81mL Infarction Location:Left MCA territory IMPRESSION: 1. 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 2. The area of reduced cerebral blood flow on the CT perfusion corresponds to a remote infarct and is likely artifactual. The area of acute ischemia on the MRI is not demonstrated on the CT perfusion exam. 3. Large area of ischemic penumbra involving the residual left MCA territory, likely secondary to the high-grade stenoses proximally. 4. 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 5. Tortuosity of the cervical internal carotid arteries bilaterally without  significant stenosis. 6. 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 Electronically Signed   By: Marin Roberts M.D.   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 Weakness beginning 2 weeks ago. Recent admission for stroke. Episodes of shaking and absence staring. EXAM: CT ANGIOGRAPHY HEAD AND NECK CT PERFUSION BRAIN TECHNIQUE: Multidetector CT imaging of the head and neck was performed using the standard protocol during bolus administration of intravenous contrast. Multiplanar CT image reconstructions and MIPs were obtained to evaluate the vascular anatomy. Carotid stenosis measurements (when applicable) are obtained utilizing NASCET criteria, using the distal internal carotid diameter as the denominator. Multiphase CT imaging of the brain was performed following IV bolus contrast injection. Subsequent parametric perfusion maps were calculated using RAPID software. CONTRAST:  ISOVUE-370 IOPAMIDOL (ISOVUE-370) INJECTION 76% COMPARISON:  MRI brain 08/13/2018 at 4:37 p.m. FINDINGS: CT HEAD FINDINGS Brain: Remote left ACA territory infarct is noted. Additional areas of remote left MCA territory infarcts are present. The areas of more acute infarct are less well delineated on noncontrast CT of the head. The right hemisphere is unremarkable. The brainstem and cerebellum are normal. Vascular: No hyperdense vessel or unexpected calcification. Skull: Calvarium is intact. No focal lytic or blastic lesions are present. Sinuses/Orbits: The paranasal sinuses mastoid air cells are clear. Globes and orbits are within normal limits. ASPECTS (Alberta Stroke Program Early CT Score) - Ganglionic level infarction (caudate, lentiform nuclei, internal capsule, insula, M1-M3 cortex): 7/7 - Supraganglionic infarction (M4-M6 cortex): 3/3 Total score (0-10 with 10 being normal): 10/10 Review of the MIP images confirms the above findings CTA NECK FINDINGS Aortic arch: A three-vessel arch configuration is present. There is no significant calcification at the aortic arch or stenosis. Right carotid system: Right common  carotid artery is within normal limits. Right ICA bifurcation scratched at the right carotid bifurcation is normal. There is tortuosity of the distal right ICA without significant stenosis. Left carotid system: The left common carotid artery is within normal limits. Bifurcation is unremarkable. There is moderate tortuosity of the distal cervical ICA without significant stenosis. Vertebral arteries: The vertebral arteries originate from the subclavian arteries bilaterally. There is a high-grade stenosis of the left vertebral artery at its origin. The right vertebral artery is the dominant vessel. No other focal stenosis or vascular injury is present. Skeleton: Vertebral body heights alignment are maintained. 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 No focal lytic or blastic lesions are present. Hyperostosis is noted. Other neck: Subcentimeter nodules are present within the right lobe of the thyroid. No dominant lesion is present. No follow-up is necessary. No significant cervical adenopathy is present. Salivary glands are within normal limits. No focal mucosal lesions are present. Upper chest: There is dependent atelectasis at the lung apices. Mild interstitial coarsening suggests mild edema. Review of the MIP images confirms the above findings CTA HEAD FINDINGS Anterior circulation: Atherosclerotic changes are present within the cavernous internal carotid arteries bilaterally. The lumen of the right ICA is narrowed to 1.5 mm. This compares to more distal vessel of 3 mm. There is a high-grade stenosis of the paraclinoid left ICA. 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 This compares with 2.6 mm at the bifurcation. More mild atherosclerotic changes are present in the right M1 segment. Right MCA bifurcation is intact. Left MCA bifurcation is  intact. There is some attenuation of distal MCA branch vessels. Chronic left ACA occlusion is noted.  Posterior circulation: The right vertebral artery is the dominant vessel. PICA origins are visualized and normal. Vertebrobasilar junction is normal. Basilar artery is within normal limits. Both posterior cerebral arteries originate from the basilar tip. There is moderate narrowing of the proximal P2 segments bilaterally. Distal PCA branch vessels are intact with distal irregularity. Venous sinuses: Dural sinuses are patent. Straight sinus deep cerebral veins are intact. Cortical veins are within normal limits. Anatomic variants: None Delayed phase: Postcontrast images demonstrate no pathologic enhancement. 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 Review of the MIP images confirms the above findings CT Brain Perfusion Findings: CBF (<30%) Volume: 6mL Perfusion (Tmax>6.0s) volume: 87mL Mismatch Volume: 81mL Infarction Location:Left MCA territory IMPRESSION: 1. 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 2. The area of reduced cerebral blood flow on the CT perfusion corresponds to a remote infarct and is likely artifactual. The area of acute ischemia on the MRI is not demonstrated on the CT perfusion exam. 3. Large area of ischemic penumbra involving the residual left MCA territory, likely secondary to the high-grade stenoses proximally. 4. 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 5. Tortuosity of the cervical internal carotid arteries bilaterally without significant stenosis. 6. 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 Electronically Signed   By: Marin Roberts M.D.   On: 08/13/2018 19:29   Mr Brain Wo Contrast (neuro Protocol)  Result Date: 08/13/2018 CLINICAL DATA:  Seizure like episodes for 2 weeks. History of stroke, hypertension and diabetes. EXAM: MRI HEAD WITHOUT CONTRAST TECHNIQUE: Multiplanar, multiecho pulse sequences of the brain and surrounding  structures were obtained without intravenous contrast. COMPARISON:  None. FINDINGS: Mild motion degraded examination. INTRACRANIAL CONTENTS: Patchy LEFT parietal reduced diffusion with nearly normalized ADC values and bright FLAIR signal. Spurious reduced diffusion mesial LEFT frontoparietal lobes associated with confluent encephalomalacia and susceptibility artifact. Ex vacuo dilatation LEFT lateral ventricle. Patchy LEFT parietooccipital lobe encephalomalacia. Smaller T2 bright LEFT cerebral peduncle and patchy LEFT pontine T2 hyperintense signal consistent with wallerian degeneration. Patchy supratentorial white matter FLAIR T2 hyperintensities. No susceptibility artifact to suggest hemorrhage. The ventricles and sulci are normal for patient's age. No suspicious parenchymal signal, masses, mass effect. No abnormal extra-axial fluid collections. No extra-axial masses. VASCULAR: Normal major intracranial vascular flow voids present at skull base. SKULL AND UPPER CERVICAL SPINE: No abnormal sellar expansion. No suspicious calvarial bone marrow signal. Craniocervical junction maintained. SINUSES/ORBITS: The mastoid air-cells and included paranasal sinuses are well-aerated.The included ocular globes and orbital contents are non-suspicious. OTHER: None. IMPRESSION: 1. Mild motion degraded examination. Acute to subacute LEFT parietal/MCA territory infarct. 2. 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 3. Mild-to-moderate chronic small vessel ischemic changes. Electronically Signed   By: Awilda Metro M.D.   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 Weakness beginning 2 weeks ago. Recent admission for stroke. Episodes of shaking and absence staring. EXAM: CT ANGIOGRAPHY HEAD AND NECK CT PERFUSION BRAIN TECHNIQUE: Multidetector CT imaging of the head and neck was performed using the  standard protocol during bolus administration of intravenous contrast. Multiplanar CT image reconstructions and MIPs were obtained to evaluate the vascular anatomy. Carotid stenosis measurements (when applicable) are obtained utilizing NASCET criteria,  using the distal internal carotid diameter as the denominator. Multiphase CT imaging of the brain was performed following IV bolus contrast injection. Subsequent parametric perfusion maps were calculated using RAPID software. CONTRAST:  ISOVUE-370 IOPAMIDOL (ISOVUE-370) INJECTION 76% COMPARISON:  MRI brain 08/13/2018 at 4:37 p.m. FINDINGS: CT HEAD FINDINGS Brain: Remote left ACA territory infarct is noted. Additional areas of remote left MCA territory infarcts are present. The areas of more acute infarct are less well delineated on noncontrast CT of the head. The right hemisphere is unremarkable. The brainstem and cerebellum are normal. Vascular: No hyperdense vessel or unexpected calcification. Skull: Calvarium is intact. No focal lytic or blastic lesions are present. Sinuses/Orbits: The paranasal sinuses mastoid air cells are clear. Globes and orbits are within normal limits. ASPECTS (Alberta Stroke Program Early CT Score) - Ganglionic level infarction (caudate, lentiform nuclei, internal capsule, insula, M1-M3 cortex): 7/7 - Supraganglionic infarction (M4-M6 cortex): 3/3 Total score (0-10 with 10 being normal): 10/10 Review of the MIP images confirms the above findings CTA NECK FINDINGS Aortic arch: A three-vessel arch configuration is present. There is no significant calcification at the aortic arch or stenosis. Right carotid system: Right common carotid artery is within normal limits. Right ICA bifurcation scratched at the right carotid bifurcation is normal. There is tortuosity of the distal right ICA without significant stenosis. Left carotid system: The left common carotid artery is within normal limits. Bifurcation is unremarkable. There is moderate  tortuosity of the distal cervical ICA without significant stenosis. Vertebral arteries: The vertebral arteries originate from the subclavian arteries bilaterally. There is a high-grade stenosis of the left vertebral artery at its origin. The right vertebral artery is the dominant vessel. No other focal stenosis or vascular injury is present. Skeleton: Vertebral body heights alignment are maintained. 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 No focal lytic or blastic lesions are present. Hyperostosis is noted. Other neck: Subcentimeter nodules are present within the right lobe of the thyroid. No dominant lesion is present. No follow-up is necessary. No significant cervical adenopathy is present. Salivary glands are within normal limits. No focal mucosal lesions are present. Upper chest: There is dependent atelectasis at the lung apices. Mild interstitial coarsening suggests mild edema. Review of the MIP images confirms the above findings CTA HEAD FINDINGS Anterior circulation: Atherosclerotic changes are present within the cavernous internal carotid arteries bilaterally. The lumen of the right ICA is narrowed to 1.5 mm. This compares to more distal vessel of 3 mm. There is a high-grade stenosis of the paraclinoid left ICA. 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 This compares with 2.6 mm at the bifurcation. More mild atherosclerotic changes are present in the right M1 segment. Right MCA bifurcation is intact. Left MCA bifurcation is intact. There is some attenuation of distal MCA branch vessels. Chronic left ACA occlusion is noted. Posterior circulation: The right vertebral artery is the dominant vessel. PICA origins are visualized and normal. Vertebrobasilar junction is normal. Basilar artery is within normal limits. Both posterior cerebral arteries originate from the basilar tip. There is moderate narrowing of the proximal P2 segments  bilaterally. Distal PCA branch vessels are intact with distal irregularity. Venous sinuses: Dural sinuses are patent. Straight sinus deep cerebral veins are intact. Cortical veins are within normal limits. Anatomic variants: None Delayed phase: Postcontrast images demonstrate no pathologic enhancement. 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 Review of the MIP images confirms  the above findings CT Brain Perfusion Findings: CBF (<30%) Volume: 6mL Perfusion (Tmax>6.0s) volume: 87mL Mismatch Volume: 81mL Infarction Location:Left MCA territory IMPRESSION: 1. 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 2. The area of reduced cerebral blood flow on the CT perfusion corresponds to a remote infarct and is likely artifactual. The area of acute ischemia on the MRI is not demonstrated on the CT perfusion exam. 3. Large area of ischemic penumbra involving the residual left MCA territory, likely secondary to the high-grade stenoses proximally. 4. 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 5. Tortuosity of the cervical internal carotid arteries bilaterally without significant stenosis. 6. 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 Electronically Signed   By: Marin Roberts M.D.   On: 08/13/2018 19:29   Ir Angio Intra Extracran Sel Com Carotid Innominate Bilat Mod Sed  Result Date: 08/18/2018 CLINICAL DATA:  Aphasia and right-sided weakness. EXAM: IR ANGIO VERTEBRAL SEL SUBCLAVIAN INNOMINATE UNI LEFT MOD SED; IR ANGIO VERTEBRAL SEL VERTEBRAL UNI RIGHT MOD SED; BILATERAL COMMON CAROTID AND INNOMINATE ANGIOGRAPHY COMPARISON:  MRI of the head of 08/13/2018, and MRA of the brain of 08/13/2018. MEDICATIONS: Heparin 1000 units IV; no antibiotic was administered within 1 hour of the procedure. ANESTHESIA/SEDATION: Versed 1 mg IV; Fentanyl 25 mcg IV Moderate Sedation Time:  25  minutes The patient was continuously monitored during the procedure by the interventional radiology nurse under my direct supervision. CONTRAST:  Isovue 300 approximately 60 cc FLUOROSCOPY TIME:  Fluoroscopy Time: 7 minutes 0 seconds (1279 mGy). COMPLICATIONS: None immediate. TECHNIQUE: Informed written consent was obtained from the patient after a thorough discussion of the procedural risks, benefits and alternatives. All questions were addressed. Maximal Sterile Barrier Technique was utilized including caps, mask, sterile gowns, sterile gloves, sterile drape, hand hygiene and skin antiseptic. A timeout was performed prior to the initiation of the procedure. The right groin was prepped and draped in the usual sterile fashion. Thereafter using modified Seldinger technique, transfemoral access into the right common femoral artery was obtained without difficulty. Over a 0.035 inch guidewire, a 5 French Pinnacle sheath was inserted. Through this, and also over 0.035 inch guidewire, a 5 [place] JB 1 catheter was advanced to the aortic arch region and selectively positioned in the right common carotid artery, the right vertebral artery, the left common carotid artery and the left vertebral artery. FINDINGS: The right common carotid arteriogram demonstrates the right external carotid artery and its major branches to be widely patent. The right internal carotid artery at the bulb to the cranial 2/3 is widely patent. There is a U-shaped tortuosity with mild fusiform dilatation of the distal right internal carotid cervical segment. The petrous segment is widely patent. There is mild to moderate stenosis of the proximal cavernous segment. The distal cavernous and the supraclinoid segments are widely patent. The right middle cerebral artery and the right anterior cerebral artery opacify into the capillary and venous phases. The right vertebral artery origin is widely patent. The vessel opacifies to the cranial skull base. Wide  patency is seen of the right vertebrobasilar junction and the right posterior-inferior cerebellar artery. The basilar artery, the posterior cerebral arteries, the superior cerebellar arteries and the anterior-inferior cerebellar arteries is normal into the capillary and venous phases. There is a stump of the left vertebrobasilar junction with no clearance of contrast at this site. The left common carotid arteriogram demonstrates the left external carotid artery and its  major branches to be widely patent. The left common carotid arteriogram demonstrates the left external carotid artery and its major branches to be widely patent. The left internal carotid artery at the bulb to the cranial skull base demonstrates wide patency in its proximal 2/3. The distal 1/3 demonstrates moderate tortuosity. Distal to this the left internal carotid artery assumes normal caliber. The petrous segment is widely patent. There is approximately 50% stenosis of the petrous cavernous junction, and also the proximal supraclinoid left ICA. Distal to this the left internal carotid artery terminus appears widely patent. There is complete occlusion of the left anterior cerebral artery and then the distal A1 segment. The left middle cerebral artery demonstrates a segmental severe focal irregular narrowing with patency of the left MCA trifurcation branches. Attenuated caliber of the MCA trifurcation branches is noted especially of the anterior perisylvian branches. There is no significant reconstitution of the anterior cerebral artery distribution from the perisylvian branches. The origin of the left vertebral artery has a severe 90% plus narrowing. The vessel is seen to opacify to the cranial skull base. Patency is seen of the left vertebrobasilar junction and the left posterior-inferior cerebellar artery. Also demonstrated is flow into the distal left vertebrobasilar junction and also the proximal basilar artery. 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 50% stenosis of the left ICA petrous cavernous junction, and the proximal supraclinoid segment. 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 PLAN: Discussed findings with the patient and the patient's neurologist. Option of endovascular revascularization of the left middle cerebral artery M1 segment discussed. Final decision pending. Electronically Signed   By: Julieanne CottonSanjeev  Deveshwar M.D.   On: 08/17/2018 08:45   Ir Angio Vertebral Sel Vertebral Bilat Mod Sed  Result Date: 08/18/2018 CLINICAL DATA:  Aphasia and right-sided weakness. EXAM: IR ANGIO VERTEBRAL SEL SUBCLAVIAN INNOMINATE UNI LEFT MOD SED; IR ANGIO VERTEBRAL SEL VERTEBRAL UNI RIGHT MOD SED; BILATERAL COMMON CAROTID AND INNOMINATE ANGIOGRAPHY COMPARISON:  MRI of the head of 08/13/2018, and MRA of the brain of 08/13/2018. MEDICATIONS: Heparin 1000 units IV; no antibiotic was administered within 1 hour of the procedure. ANESTHESIA/SEDATION: Versed 1 mg IV; Fentanyl 25 mcg IV Moderate Sedation Time:  25 minutes The patient was continuously monitored during the procedure by the interventional radiology nurse under my direct supervision. CONTRAST:  Isovue 300 approximately 60 cc FLUOROSCOPY TIME:  Fluoroscopy Time: 7 minutes 0 seconds (1279 mGy). COMPLICATIONS: None immediate. TECHNIQUE: Informed written consent was obtained from the patient after a thorough discussion of the procedural risks, benefits and alternatives. All questions were addressed. Maximal Sterile Barrier Technique was utilized including caps, mask, sterile gowns, sterile gloves, sterile drape, hand hygiene and skin antiseptic. A timeout was performed prior to the initiation of the procedure. The right groin was prepped and draped in the usual sterile fashion. Thereafter using modified Seldinger technique, transfemoral access into  the right common femoral artery was obtained without difficulty. Over a 0.035 inch guidewire, a 5 French Pinnacle sheath was inserted. Through this, and also over 0.035 inch guidewire, a 5 [place] JB 1 catheter was advanced to the aortic arch region and selectively positioned in the right common carotid artery, the right vertebral artery, the left common carotid artery and the left vertebral artery. FINDINGS: The right common carotid arteriogram demonstrates the right external carotid artery and its major branches to be widely patent. The  right internal carotid artery at the bulb to the cranial 2/3 is widely patent. There is a U-shaped tortuosity with mild fusiform dilatation of the distal right internal carotid cervical segment. The petrous segment is widely patent. There is mild to moderate stenosis of the proximal cavernous segment. The distal cavernous and the supraclinoid segments are widely patent. The right middle cerebral artery and the right anterior cerebral artery opacify into the capillary and venous phases. The right vertebral artery origin is widely patent. The vessel opacifies to the cranial skull base. Wide patency is seen of the right vertebrobasilar junction and the right posterior-inferior cerebellar artery. The basilar artery, the posterior cerebral arteries, the superior cerebellar arteries and the anterior-inferior cerebellar arteries is normal into the capillary and venous phases. There is a stump of the left vertebrobasilar junction with no clearance of contrast at this site. The left common carotid arteriogram demonstrates the left external carotid artery and its major branches to be widely patent. The left common carotid arteriogram demonstrates the left external carotid artery and its major branches to be widely patent. The left internal carotid artery at the bulb to the cranial skull base demonstrates wide patency in its proximal 2/3. The distal 1/3 demonstrates moderate tortuosity. Distal  to this the left internal carotid artery assumes normal caliber. The petrous segment is widely patent. There is approximately 50% stenosis of the petrous cavernous junction, and also the proximal supraclinoid left ICA. Distal to this the left internal carotid artery terminus appears widely patent. There is complete occlusion of the left anterior cerebral artery and then the distal A1 segment. The left middle cerebral artery demonstrates a segmental severe focal irregular narrowing with patency of the left MCA trifurcation branches. Attenuated caliber of the MCA trifurcation branches is noted especially of the anterior perisylvian branches. There is no significant reconstitution of the anterior cerebral artery distribution from the perisylvian branches. The origin of the left vertebral artery has a severe 90% plus narrowing. The vessel is seen to opacify to the cranial skull base. Patency is seen of the left vertebrobasilar junction and the left posterior-inferior cerebellar artery. Also demonstrated is flow into the distal left vertebrobasilar junction and also the proximal basilar artery. 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 50% stenosis of the left ICA petrous cavernous junction, and the proximal supraclinoid segment. 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 PLAN: Discussed findings with the patient and the patient's neurologist. Option of endovascular revascularization of the left middle cerebral artery M1 segment discussed. Final decision pending. Electronically Signed   By: Julieanne Cotton M.D.   On: 08/17/2018 08:45    PHYSICAL EXAM  Temp:  [98 F (36.7 C)-98.5 F (36.9 C)] 98 F (36.7 C) (12/05 1200) Pulse Rate:  [78-93] 87 (12/05 1215) Resp:  [11-20] 20 (12/05 1215) BP: (87-172)/(61-131) 172/101 (12/05 1300) SpO2:  [89 %-97 %] 97 %  (12/05 1215)  General- Well nourished, well developed, in no apparent distress.  Ophthalmologic- fundi not visualized due to noncooperation.  Cardiovascular - Regular rate and rhythm.  Neuro - awake, alert, lying in bed, orientated to self, months and people, not orientated to year. Able to follow simple commands, able to name 3 out of 3, able to repeat, paucity of speech,mild to moderate dysarthria, intermittent paraphasic errors. Visual field full. PERRL, EOMI. Mild right facial droop, tongue midline. Sensation symmetrical bilateral face.  Left upper and lower extremity 5/5 muscle strength, right upper extremity 3+/5 proximal, 3/5 distally. Right lower extremity proximal not able to test due to procedure but3+/9foot DF.Sensationsymmetrical. Bilateral finger-to-nose no ataxia but slow in action. Gait not tested.   ASSESSMENT/PLAN Ms. Bernita Soman is a 47 y.o. female with history of previous stroke, Htn, and DM presenting for elective left MCA stenting vs. PCI.   Recent stroke: Lt MCA small acute on large chronic infarct - likely due to left cavernous ICA and MCA high-grade stenosis.    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 Multiple old infarcts involving left ACA and left anterior and posterior frontal MCA.  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   Cerebral angiogram showed severe left M1 stenosis, bilateral P1 stenosis, left AV CEA occluded, left ICA cavernous 40% stenosis.  S/p left M1 PCI with Dr. Corliss Skains 08/26/18  2D Echo  - EF 65 - 70%. No cardiac source of emboli identified.   LDL - 83  HgbA1c 6.9  Hypercoagulable and autoimmune work-up negative except slightly  elevated homocystine and rheumatoid factor, doubt any clinical significance.   UDS - negative  EEG - left hemispheric intermittent slowing, no seizure  VTE prophylaxis - SCDs  Diet - Heart healthy / carb modified with thin liquids.  aspirin 81 mg daily and Brilinta prior to admission, now on aspirin 81 and Brilinta.  Continue on discharge  Patient counseled to be compliant with her antithrombotic medications  Ongoing aggressive stroke risk factor management  Disposition:   Home with mom  Complex partial seizure  Diagnosed last admission due to right arm shaking jerking with tonic flexion lasting 15 to 30 seconds, followed by Todd's paralysis for 30 minutes also associate with staring spells.  Put on Keppra  EEG no seizure but left hemispheric intermittent slowing  Continue Keppra for seizure control on discharge  History of stroke  2017 while she was working and did not feel good.  She was admitted for 3 to 4 days for headache and high BP and was told to have a stroke due to high BP and diabetes not in good control.  No deficit residual.    Second stroke in 2018 with right-sided weakness and speech difficulty, admitted to hospital for 5 days.  Was told due to blood pressure and diabetes and a stressful job.  Discharged on aspirin and Plavix which was continued until now.    07/2018 she was admitted again in [facility] with worsening right-sided weakness and speech difficulty, had MRI/CT at [facility] showed new MCA stroke    ?? Need to rule out CNS vasculitis  less likely given large vessel involvement instead of medium size or small size vessels  No typical presentation with headache and intermittent cognitive impairment  DSA suggested atherosclerosis disease instead of CNS vasculitis  Not a candidate for LP at this time given Brilinta use.   She has outpatient follow-[facility] on  09/18/2018  Hypertension  Stable  Long-term BP goal 130-150 given severe intracranial stenosis  Hyperlipidemia  Lipid lowering medication PTA:  Lipitor 80 mg daily and Zetia 10 mg daily  LDL 83, goal < 70  Current lipid lowering medication: Lipitor 80 mg daily  and Zetia 10 mg daily  Continue statin at discharge  Diabetes, type II  HgbA1c 6.9, goal < 7.0  Controlled  Home meds includes insulin and glimepiride  Glucose under control in the hospital  SSI  CBG monitoring  Other Stroke Risk Factors  Obesity, Body mass index is 34.52 kg/m., recommend weight loss, diet and exercise as appropriate   Other Active Problems  Cognitive impairment - residual deficit from previous stroke   Hospital day # 1  Neurology will sign off. Please call with questions. Pt will follow up with stroke clinic NP at [facility] on 09/18/2018. Thanks for the consult.  Marvel Plan, MD PhD Stroke Neurology 08/27/2018 10:03 PM    To contact Stroke Continuity provider, please refer to [email or web address]. After hours, contact General Neurology

## 2018-08-27 NOTE — Progress Notes (Signed)
Discharge instructions given to patient and mother. All questions answered. Patient stable at time of discharge. Dicie BeamFrazier, Maddie Brazier RN BSN.

## 2018-08-27 NOTE — Discharge Summary (Signed)
Patient ID: Julia Ferguson MRN: [id number] DOB/AGE: 49/12/1970 49 y.o.  Admit date: 08/26/2018 Discharge date: 08/27/2018  Supervising Physician: Julieanne Cottoneveshwar, Sanjeev  [facility]  Admission Diagnoses: CVA  Discharge Diagnoses:  Active Problems:   Middle cerebral artery stenosis, left   Discharged Condition: good  Hospital Course:  Julia Ferguson is a 47 y.o. 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   On 08/07/2018, [facility].   She was discharged home 08/08/2018.   She presented to [facility] ED 08/13/2018 after being referred by her PCP for symptoms of right arm jerking/weakness, absent staring, and transient worsening of speech.   She was found to have multiple areas of intracranial stenosis, and [facility] and [facility] evaluation.   She underwent an image-guided diagnostic cerebral angiogram 08/15/2018 by Dr. Corliss Skainseveshwar which revealed left MCA stenosis. She was discharged home 08/17/2018 in stable condition.  Yesterday she underwent a cerebral angiogram with left MCA stenosis angioplasty/stent placement by Dr. Corliss Skainseveshwar.  She is awake and alert laying in bed.  She has some right-sided weakness/numbness/tingling which is unchanged.  Her speech has improved some. She can say the month and year today, she could not yesterday.  She continues to have a right foot drop.   Overall she has some improvement.  She is ready for discharge home today.  Consults: None  Significant Diagnostic Studies:  Lab Results  Component Value Date   WBC 15.8 (H) 08/27/2018   HGB 9.8 (L) 08/27/2018   HCT 31.9 (L) 08/27/2018   MCV 86.2 08/27/2018   PLT 395 08/27/2018   Lab Results  Component Value Date   CREATININE 1.08 (H) 08/27/2018   BUN 10 08/27/2018   NA 137 08/27/2018   K 3.6 08/27/2018   CL 105 08/27/2018   CO2 19  (L) 08/27/2018     Treatments: S/P  Lt common carotid arteriogram followed by balloon angioplasty of Lt MCA with patency of approx 90%  Discharge Exam: Blood pressure 134/72, pulse 91, temperature 98.5 F (36.9 C), temperature source Oral, resp. rate 16, SpO2 95 %. Awake and alert and oriented x 3 She can say the month and year today (she could not yesterday) Speech has improved some Lying flat in bed Left groin no longer bleeding. No hematoma, no pseudoaneurysm 3/5 right foot. Difficulty with dorsiflexion She has good right grip, but fine motor is not intact on the right. Tongue midline Mild difficulty with pursed lips Good shoulder shrug   Disposition: Discharge disposition: 01-Home or Self Care     Will arrange follow up in 2 weeks with Dr. Corliss Skainseveshwar. Ok to start physical therapy tomorrow.  Discharge Instructions    Call MD for:  difficulty breathing, headache or visual disturbances   Complete by:  As directed    Call MD for:  persistant dizziness or light-headedness   Complete by:  As directed    Call MD for:  persistant nausea and vomiting   Complete by:  As directed    Call MD for:  redness, tenderness, or signs of infection (pain, swelling, redness, odor or green/yellow discharge around incision site)   Complete by:  As directed    Diet - low sodium heart healthy   Complete by:  As directed    Increase activity slowly   Complete by:  As directed      Allergies as of  08/27/2018   No Known Allergies     Medication List    TAKE these medications   aspirin EC 81 MG tablet Take 81 mg by mouth daily.   aspirin 325 MG EC tablet Take 1 tablet (325 mg total) by mouth daily.   atorvastatin 80 MG tablet Commonly known as:  LIPITOR Take 80 mg by mouth every evening.   BASAGLAR KWIKPEN 100 UNIT/ML Sopn Inject 45 Units into the skin daily.   carvedilol 6.25 MG tablet Commonly known as:  COREG Take 1 tablet (6.25 mg total) by mouth 2 (two) times daily with a  meal.   ezetimibe 10 MG tablet Commonly known as:  ZETIA Take 10 mg by mouth daily.   gabapentin 600 MG tablet Commonly known as:  NEURONTIN Take 600 mg by mouth at bedtime.   insulin aspart 100 UNIT/ML injection Commonly known as:  novoLOG Substitute to any brand approved. . Before each meal 3 times a day, 140-199 - 2 units, 200-250 - 4 units, 251-299 - 6 units,  300-349 - 8 units,  350 or above 10 units. Dispense syringes and needles as needed, Ok to switch to PEN if approved. DX DM2, Code E11.65   Insulin Syringe-Needle U-100 25G X 1" 1 ML Misc For 4 times a day insulin SQ, 1 month supply. Diagnosis E11.65   losartan 100 MG tablet Commonly known as:  COZAAR Take 50 mg by mouth daily.   niacin 100 MG tablet Take 1 tablet (100 mg total) by mouth 3 (three) times daily with meals.   pantoprazole 40 MG tablet Commonly known as:  PROTONIX Take 1 tablet (40 mg total) by mouth daily.   ticagrelor 60 MG Tabs tablet Commonly known as:  BRILINTA Take 1.5 tablets (90 mg total) by mouth 2 (two) times daily.   traMADol 50 MG tablet Commonly known as:  ULTRAM Take 50 mg by mouth daily as needed for moderate pain.         Electronically Signed: Gwynneth Macleod, PA-C 08/27/2018, 9:09 AM     I have spent Less Than 30 Minutes discharging Julia Ferguson.

## 2018-08-28 ENCOUNTER — Encounter (HOSPITAL_COMMUNITY): Payer: Self-pay | Admitting: Interventional Radiology

## 2018-09-09 ENCOUNTER — Ambulatory Visit (HOSPITAL_COMMUNITY): Payer: Medicaid - Out of State

## 2018-09-09 ENCOUNTER — Ambulatory Visit (HOSPITAL_COMMUNITY): Admission: RE | Admit: 2018-09-09 | Payer: Medicaid - Out of State | Source: Ambulatory Visit

## 2018-09-09 MED ORDER — ONDANSETRON HCL 4 MG/2ML IJ SOLN
4.00 | INTRAMUSCULAR | Status: DC
Start: ? — End: 2018-09-09

## 2018-09-09 MED ORDER — OXYCODONE HCL 5 MG PO TABS
2.50 | ORAL_TABLET | ORAL | Status: DC
Start: ? — End: 2018-09-09

## 2018-09-09 MED ORDER — GLUCAGON HCL RDNA (DIAGNOSTIC) 1 MG IJ SOLR
1.00 | INTRAMUSCULAR | Status: DC
Start: ? — End: 2018-09-09

## 2018-09-09 MED ORDER — INSULIN REGULAR HUMAN 100 UNIT/ML IJ SOLN
.00 | INTRAMUSCULAR | Status: DC
Start: 2018-09-09 — End: 2018-09-09

## 2018-09-09 MED ORDER — DEXTROSE 50 % IV SOLN
12.50 | INTRAVENOUS | Status: DC
Start: ? — End: 2018-09-09

## 2018-09-09 MED ORDER — GENERIC EXTERNAL MEDICATION
20.00 | Status: DC
Start: 2018-09-09 — End: 2018-09-09

## 2018-09-09 MED ORDER — BISACODYL 10 MG RE SUPP
10.00 | RECTAL | Status: DC
Start: ? — End: 2018-09-09

## 2018-09-09 MED ORDER — AMLODIPINE BESYLATE 10 MG PO TABS
10.00 | ORAL_TABLET | ORAL | Status: DC
Start: 2018-09-29 — End: 2018-09-09

## 2018-09-09 MED ORDER — CARVEDILOL 25 MG PO TABS
25.00 | ORAL_TABLET | ORAL | Status: DC
Start: 2018-09-09 — End: 2018-09-09

## 2018-09-09 MED ORDER — LABETALOL HCL 5 MG/ML IV SOLN
10.00 | INTRAVENOUS | Status: DC
Start: ? — End: 2018-09-09

## 2018-09-09 MED ORDER — POLYETHYLENE GLYCOL 3350 17 G PO PACK
17.00 | PACK | ORAL | Status: DC
Start: ? — End: 2018-09-09

## 2018-09-09 MED ORDER — SENNOSIDES-DOCUSATE SODIUM 8.6-50 MG PO TABS
2.00 | ORAL_TABLET | ORAL | Status: DC
Start: 2018-09-09 — End: 2018-09-09

## 2018-09-09 MED ORDER — ACETAMINOPHEN 325 MG PO TABS
975.00 | ORAL_TABLET | ORAL | Status: DC
Start: ? — End: 2018-09-09

## 2018-09-09 MED ORDER — LOSARTAN POTASSIUM 50 MG PO TABS
100.00 | ORAL_TABLET | ORAL | Status: DC
Start: 2018-09-29 — End: 2018-09-09

## 2018-09-09 MED ORDER — INSULIN GLARGINE 100 UNIT/ML ~~LOC~~ SOLN
24.00 | SUBCUTANEOUS | Status: DC
Start: 2018-09-10 — End: 2018-09-09

## 2018-09-09 MED ORDER — HYDRALAZINE HCL 20 MG/ML IJ SOLN
10.00 | INTRAMUSCULAR | Status: DC
Start: ? — End: 2018-09-09

## 2018-09-09 MED ORDER — INSULIN REGULAR HUMAN 100 UNIT/ML IJ SOLN
8.00 | INTRAMUSCULAR | Status: DC
Start: 2018-09-09 — End: 2018-09-09

## 2018-09-09 MED ORDER — HEPARIN SODIUM (PORCINE) 5000 UNIT/ML IJ SOLN
5000.00 | INTRAMUSCULAR | Status: DC
Start: 2018-09-09 — End: 2018-09-09

## 2018-09-09 MED ORDER — LIDOCAINE VISCOUS HCL 2 % MT SOLN
5.00 | OROMUCOSAL | Status: DC
Start: ? — End: 2018-09-09

## 2018-09-18 ENCOUNTER — Ambulatory Visit: Payer: Self-pay | Admitting: Adult Health

## 2018-09-28 MED ORDER — GENERIC EXTERNAL MEDICATION
20.00 | Status: DC
Start: 2018-09-28 — End: 2018-09-28

## 2018-09-28 MED ORDER — INSULIN LISPRO 100 UNIT/ML ~~LOC~~ SOLN
0.00 | SUBCUTANEOUS | Status: DC
Start: 2018-09-28 — End: 2018-09-28

## 2018-09-28 MED ORDER — HYDRALAZINE HCL 10 MG PO TABS
5.00 | ORAL_TABLET | ORAL | Status: DC
Start: 2018-09-28 — End: 2018-09-28

## 2018-09-28 MED ORDER — INSULIN LISPRO 100 UNIT/ML ~~LOC~~ SOLN
8.00 | SUBCUTANEOUS | Status: DC
Start: 2018-09-28 — End: 2018-09-28

## 2018-09-28 MED ORDER — POLYETHYLENE GLYCOL 3350 17 G PO PACK
17.00 | PACK | ORAL | Status: DC
Start: ? — End: 2018-09-28

## 2018-09-28 MED ORDER — ZINC OXIDE 20 % EX OINT
TOPICAL_OINTMENT | CUTANEOUS | Status: DC
Start: 2018-09-28 — End: 2018-09-28

## 2018-09-28 MED ORDER — INSULIN GLARGINE 100 UNIT/ML ~~LOC~~ SOLN
16.00 | SUBCUTANEOUS | Status: DC
Start: 2018-09-28 — End: 2018-09-28

## 2018-09-28 MED ORDER — INSULIN LISPRO 100 UNIT/ML ~~LOC~~ SOLN
8.00 | SUBCUTANEOUS | Status: DC
Start: 2018-09-29 — End: 2018-09-28

## 2018-09-28 MED ORDER — CARVEDILOL 25 MG PO TABS
25.00 | ORAL_TABLET | ORAL | Status: DC
Start: 2018-09-28 — End: 2018-09-28

## 2018-09-28 MED ORDER — POLYETHYLENE GLYCOL 3350 17 G PO PACK
17.00 | PACK | ORAL | Status: DC
Start: 2018-09-29 — End: 2018-09-28

## 2018-09-28 MED ORDER — ENOXAPARIN SODIUM 40 MG/0.4ML ~~LOC~~ SOLN
40.00 | SUBCUTANEOUS | Status: DC
Start: 2018-09-29 — End: 2018-09-28

## 2018-09-28 MED ORDER — INSULIN LISPRO 100 UNIT/ML ~~LOC~~ SOLN
4.00 | SUBCUTANEOUS | Status: DC
Start: 2018-09-28 — End: 2018-09-28

## 2018-09-28 MED ORDER — LIDOCAINE HCL 1 % IJ SOLN
0.50 | INTRAMUSCULAR | Status: DC
Start: ? — End: 2018-09-28

## 2018-09-28 MED ORDER — ASPIRIN 81 MG PO CHEW
81.00 | CHEWABLE_TABLET | ORAL | Status: DC
Start: 2018-09-29 — End: 2018-09-28

## 2018-09-28 MED ORDER — SENNOSIDES-DOCUSATE SODIUM 8.6-50 MG PO TABS
2.00 | ORAL_TABLET | ORAL | Status: DC
Start: 2018-09-28 — End: 2018-09-28

## 2018-09-28 MED ORDER — ACETAMINOPHEN 325 MG PO TABS
975.00 | ORAL_TABLET | ORAL | Status: DC
Start: ? — End: 2018-09-28

## 2018-10-26 ENCOUNTER — Ambulatory Visit (HOSPITAL_COMMUNITY)
Admission: RE | Admit: 2018-10-26 | Discharge: 2018-10-26 | Disposition: A | Discharge status: Home / Self Care | Payer: Medicaid - Out of State | Source: Ambulatory Visit | Attending: Physician Assistant | Admitting: Physician Assistant

## 2018-10-26 ENCOUNTER — Encounter (HOSPITAL_COMMUNITY): Payer: Self-pay

## 2018-10-26 DIAGNOSIS — I6602 Occlusion and stenosis of left middle cerebral artery: Secondary | ICD-10-CM

## 2018-10-26 NOTE — Progress Notes (Signed)
Chief Complaint: Patient was seen in consultation today for left MCA M1 stenosis s/p revascularization.  Referring Physician(s): Rinehuls, Kinnie Scales  Supervising Physician: Julieanne Cotton  Patient Status: [facility] - Out-pt  History of Present Illness: Julia Ferguson is a 48 y.o. female with a past medical history as below, with pertinent past medical history including hypertension, CVAs 2017 2018 2019, CKD, and diabetes mellitus. She is known to [facility] and has been followed by Dr. Corliss Skains since 07/2018. She first presented to our department by Delton See, PA-C for management of symptomatic left MCA M1 segment stenosis. She underwent an image-guided cerebral arteriogram with revascularization of her left MCA M1 segment stenosis using angioplasty 08/26/2018 by Dr. Corliss Skains. Patient was discharged home 08/27/2018.  Patient presents today for follow-up regarding her recent procedure 08/26/2018. Patient awake and alert sitting in wheelchair. Accompanied by mother and two other family members. Mother wanted to give an update on events since discharge- states that on Sunday 08/30/2018, patient demonstrated stroke-like symptoms (worsened dysarthria, right facial droop, and right-sided neglect) and patient's mother immediately called 911. She was then taken to a hospital in [place], [place] where she was diagnosed with an intracranial hemorrhage. Per mother, attempts to call [facility]/Dr. Corliss Skains were made, but they were told that there were no beds at [facility] and Dr. Corliss Skains was not available, so she was taken to [facility] for admission and management. She was discharged approximately 1 month later. Her Brilinta was discontinued upon discharge due to intracranial hemorrhage. Complains of RUE weakness, improved since [facility] admission. States she sees PT/OT for this. Complains of dysarthria/difficulty with word finding, improved since [facility] admission. States she sees speech therapy for this. Complains of intermittent  blurred vision. Denies headache, numbness/tingling, dizziness, diplopia/curtain over eyes/blindness, hearing changes, or tinnitus.  Patient is currently taking Aspirin 81 mg once daily.   Past Medical History:  Diagnosis Date  . Chronic kidney disease   . Diabetes mellitus without complication (HCC)   . Hypertension   . Right sided weakness    post - stroke  . Stroke [facility]) 2017, 2018, 2019    Past Surgical History:  Procedure Laterality Date  . ABDOMINAL HYSTERECTOMY    . CESAREAN SECTION    . EYE SURGERY     laser   . IR ANGIO INTRA EXTRACRAN SEL COM CAROTID INNOMINATE BILAT MOD SED  08/15/2018  . IR ANGIO VERTEBRAL SEL VERTEBRAL BILAT MOD SED  08/15/2018  . IR PTA INTRACRANIAL  08/26/2018  . RADIOLOGY WITH ANESTHESIA N/A 08/26/2018   Procedure: STENT PLACEMENT RADIOLOGY WITH ANESTHESIA;  Surgeon: Radiologist, Medication, MD;  Location: [facility] OR;  Service: Radiology;  Laterality: N/A;    Allergies: Patient has no known allergies.  Medications: Prior to Admission medications   Medication Sig Start Date End Date Taking? Authorizing Provider  aspirin EC 325 MG EC tablet Take 1 tablet (325 mg total) by mouth daily. Patient not taking: Reported on 08/19/2018 08/17/18   Leroy Sea, MD  aspirin EC 81 MG tablet Take 81 mg by mouth daily.    [provider]  atorvastatin (LIPITOR) 80 MG tablet Take 80 mg by mouth every evening.     [provider]  carvedilol (COREG) 6.25 MG tablet Take 1 tablet (6.25 mg total) by mouth 2 (two) times daily with a meal. 08/17/18   Leroy Sea, MD  ezetimibe (ZETIA) 10 MG tablet Take 10 mg by mouth daily.  08/12/18   [provider]  gabapentin (NEURONTIN) 600 MG  tablet Take 600 mg by mouth at bedtime.     [provider]  insulin aspart (NOVOLOG) 100 UNIT/ML injection Substitute to any brand approved. . Before each meal 3 times a day, 140-199 - 2 units, 200-250 - 4 units, 251-299 - 6 units,  300-349 - 8  units,  350 or above 10 units. Dispense syringes and needles as needed, Ok to switch to PEN if approved. DX DM2, Code E11.65 08/17/18   Leroy Sea, MD  Insulin Glargine (BASAGLAR KWIKPEN) 100 UNIT/ML SOPN Inject 45 Units into the skin daily.     [provider]  Insulin Syringe-Needle U-100 25G X 1" 1 ML MISC For 4 times a day insulin SQ, 1 month supply. Diagnosis E11.65 08/17/18   Leroy Sea, MD  losartan (COZAAR) 100 MG tablet Take 50 mg by mouth daily.    [provider]  niacin 100 MG tablet Take 1 tablet (100 mg total) by mouth 3 (three) times daily with meals. 08/17/18   Leroy Sea, MD  pantoprazole (PROTONIX) 40 MG tablet Take 1 tablet (40 mg total) by mouth daily. 08/17/18   Leroy Sea, MD  ticagrelor (BRILINTA) 60 MG TABS tablet Take 1.5 tablets (90 mg total) by mouth 2 (two) times daily. 08/17/18   Leroy Sea, MD  traMADol (ULTRAM) 50 MG tablet Take 50 mg by mouth daily as needed for moderate pain.     [provider]     Family History  Problem Relation Age of Onset  . Hypertension Other   . Kidney failure Other   . Hypertension Mother   . Diabetes Mother   . Colon cancer Father     Social History   Socioeconomic History  . Marital status: Divorced    Spouse name: Not on file  . Number of children: Not on file  . Years of education: Not on file  . Highest education level: Not on file  Occupational History  . Not on file  Social Needs  . Financial resource strain: Not on file  . Food insecurity:    Worry: Not on file    Inability: Not on file  . Transportation needs:    Medical: Not on file    Non-medical: Not on file  Tobacco Use  . Smoking status: Never Smoker  . Smokeless tobacco: Never Used  Substance and Sexual Activity  . Alcohol use: Never    Frequency: Never  . Drug use: Never  . Sexual activity: Not on file  Lifestyle  . Physical activity:    Days per week: Not on file    Minutes per  session: Not on file  . Stress: Not on file  Relationships  . Social connections:    Talks on phone: Not on file    Gets together: Not on file    Attends religious service: Not on file    Active member of club or organization: Not on file    Attends meetings of clubs or organizations: Not on file    Relationship status: Not on file  Other Topics Concern  . Not on file  Social History Narrative  . Not on file     Review of Systems: A 12 point ROS discussed and pertinent positives are indicated in the HPI above.  All other systems are negative.  Review of Systems  Constitutional: Negative for chills and fever.  HENT: Negative for hearing loss and tinnitus.   Eyes: Positive for visual disturbance.  Respiratory:  Negative for shortness of breath and wheezing.   Cardiovascular: Negative for chest pain and palpitations.  Neurological: Positive for speech difficulty and weakness. Negative for dizziness, numbness and headaches.  Psychiatric/Behavioral: Negative for behavioral problems and confusion.    Physical Exam Constitutional:      General: She is not in acute distress.    Appearance: Normal appearance.  Pulmonary:     Effort: Pulmonary effort is normal. No respiratory distress.  Skin:    General: Skin is warm and dry.  Neurological:     Mental Status: She is alert and oriented to person, place, and time.     Comments: Able to move RUE at shoulder but not at elbow.  Psychiatric:        Mood and Affect: Mood normal.        Behavior: Behavior normal.        Thought Content: Thought content normal.        Judgment: Judgment normal.      Labs:  CBC: Recent Labs    08/15/18 0453 08/16/18 0421 08/26/18 0935 08/27/18 0606  WBC 7.7 7.4 7.9 15.8*  HGB 10.7* 10.3* 10.5* 9.8*  HCT 33.4* 33.5* 33.9* 31.9*  PLT 352 351 406* 395    COAGS: Recent Labs    08/13/18 1722 08/26/18 0644  INR 0.97 0.99  APTT 28 29    BMP: Recent Labs    08/15/18 0453 08/16/18 0421  08/26/18 0935 08/27/18 0606  NA 137 136 138 137  K 3.4* 3.6 4.0 3.6  CL 103 106 104 105  CO2 24 21* 21* 19*  GLUCOSE 131* 123* 202* 211*  BUN 13 14 17 10   CALCIUM 9.3 9.0 9.2 8.3*  CREATININE 1.41* 1.22* 1.29* 1.08*  GFRNONAA 43* 51* 49* >60  GFRAA 50* 59* 56* >60    LIVER FUNCTION TESTS: Recent Labs    08/13/18 1722  BILITOT 1.0  AST 16  ALT 19  ALKPHOS 63  PROT 9.0*  ALBUMIN 4.4    Assessment and Plan:  Left MCA M1 segment stenosis s/p revascularization using angioplasty 08/26/2018 by Dr. Corliss Skainseveshwar. Dr. Corliss Skainseveshwar was present for consultation. D[facility]. Family states that her RUE weakness and dysarthria have improved, but still present. State that her last imaging scan was 2 weeks ago, and report that her left MCA M1 segment was "still open" and her hemorrhage was "almost gone". Explained that the best course of management for her left MCA M1 segment stenosis is with routine imaging scans to monitor for changes. This scan should be done in 6 weeks to also evaluate for residual hemorrhage.  Instructed patient to follow-up with her PCP and neurologist regularly.  Plan for follow-up CTA head/neck (with contrast, to include pre procedure CT of head without contrast) in 6 weeks. Informed patient that our schedulers will call her to set up this imaging scan. Instructed patient to continue taking Aspirin 81 mg once daily.  All questions answered and concerns addressed. Patient and family convey understanding and agree with plan.  Thank you for this interesting consult.  I greatly enjoyed meeting Parris B Kathreen Cosierinsley and look forward to participating in their care.  A copy of this report was sent to the requesting provider on this date.  Electronically Signed: Elwin MochaAlexandra Angelito Hopping, PA-C 10/26/2018, 10:06 AM   I spent a total of 40 Minutes in face to face in clinical consultation, greater than 50% of which was counseling/coordinating care for left MCA  M1 segment stenosis s/p  revascularization.

## 2018-12-22 ENCOUNTER — Telehealth (HOSPITAL_COMMUNITY): Payer: Self-pay | Admitting: Radiology

## 2018-12-22 NOTE — Telephone Encounter (Signed)
Pt's mother called asking when her daughter's f/u scan would be scheduled. States she received an authorization from [employer]. I told her that we have not yet received that authorization. She states that it expires on 01/15/19. I told her I would look into this and call her back. She agrees with this plan of care. JM

## 2019-02-17 ENCOUNTER — Other Ambulatory Visit (HOSPITAL_COMMUNITY): Payer: Self-pay | Admitting: Interventional Radiology

## 2019-02-17 DIAGNOSIS — I6609 Occlusion and stenosis of unspecified middle cerebral artery: Secondary | ICD-10-CM

## 2019-03-03 ENCOUNTER — Ambulatory Visit (HOSPITAL_COMMUNITY): Payer: Medicaid - Out of State

## 2019-03-03 ENCOUNTER — Other Ambulatory Visit: Payer: Self-pay

## 2019-03-03 ENCOUNTER — Ambulatory Visit (HOSPITAL_COMMUNITY)
Admission: RE | Admit: 2019-03-03 | Discharge: 2019-03-03 | Disposition: A | Discharge status: Home / Self Care | Payer: Medicaid - Out of State | Source: Ambulatory Visit | Attending: Interventional Radiology | Admitting: Interventional Radiology

## 2019-03-03 DIAGNOSIS — I6609 Occlusion and stenosis of unspecified middle cerebral artery: Secondary | ICD-10-CM | POA: Diagnosis not present

## 2019-03-03 LAB — POCT I-STAT CREATININE: Creatinine, Ser: 1.3 mg/dL — ABNORMAL HIGH (ref 0.44–1.00)

## 2019-03-03 MED ORDER — IOHEXOL 350 MG/ML SOLN
75.0000 mL | Freq: Once | INTRAVENOUS | Status: AC | PRN
  Administered 2019-03-03: 75 mL via INTRAVENOUS

## 2019-03-09 ENCOUNTER — Telehealth (HOSPITAL_COMMUNITY): Payer: Self-pay

## 2019-03-09 NOTE — Telephone Encounter (Signed)
Pt's mother agreed to f/u in 6 months with cta head/neck. AW

## 2019-09-14 ENCOUNTER — Other Ambulatory Visit (HOSPITAL_COMMUNITY): Payer: Self-pay | Admitting: Interventional Radiology

## 2019-09-14 DIAGNOSIS — I771 Stricture of artery: Secondary | ICD-10-CM

## 2019-09-30 ENCOUNTER — Ambulatory Visit (HOSPITAL_COMMUNITY)
Admission: RE | Admit: 2019-09-30 | Discharge: 2019-09-30 | Disposition: A | Discharge status: Home / Self Care | Payer: Medicare (Managed Care) | Source: Ambulatory Visit | Attending: Interventional Radiology | Admitting: Interventional Radiology

## 2019-09-30 ENCOUNTER — Other Ambulatory Visit: Payer: Self-pay

## 2019-09-30 DIAGNOSIS — I771 Stricture of artery: Secondary | ICD-10-CM | POA: Diagnosis present

## 2019-09-30 LAB — POCT I-STAT CREATININE: Creatinine, Ser: 1.4 mg/dL — ABNORMAL HIGH (ref 0.44–1.00)

## 2019-09-30 MED ORDER — IOHEXOL 350 MG/ML SOLN
75.0000 mL | Freq: Once | INTRAVENOUS | Status: AC | PRN
  Administered 2019-09-30: 17:00:00 75 mL via INTRAVENOUS

## 2019-10-04 ENCOUNTER — Telehealth (HOSPITAL_COMMUNITY): Payer: Self-pay

## 2019-10-04 NOTE — Telephone Encounter (Signed)
Pt agreed to f/u in 3 months with diagnostic angiogram. Pt is not currently having any sx. AW

## 2020-01-25 ENCOUNTER — Other Ambulatory Visit (HOSPITAL_COMMUNITY): Payer: Self-pay | Admitting: Interventional Radiology

## 2020-01-25 DIAGNOSIS — I771 Stricture of artery: Secondary | ICD-10-CM

## 2020-02-07 ENCOUNTER — Other Ambulatory Visit: Payer: Self-pay | Admitting: Radiology

## 2020-02-08 ENCOUNTER — Encounter (HOSPITAL_COMMUNITY): Payer: Self-pay

## 2020-02-08 ENCOUNTER — Ambulatory Visit (HOSPITAL_COMMUNITY)
Admission: RE | Admit: 2020-02-08 | Discharge: 2020-02-08 | Disposition: A | Discharge status: Home / Self Care | Payer: Medicare (Managed Care) | Source: Ambulatory Visit | Attending: Interventional Radiology | Admitting: Interventional Radiology

## 2020-02-08 ENCOUNTER — Other Ambulatory Visit (HOSPITAL_COMMUNITY): Payer: Self-pay | Admitting: Interventional Radiology

## 2020-02-08 ENCOUNTER — Other Ambulatory Visit: Payer: Self-pay

## 2020-02-08 DIAGNOSIS — I771 Stricture of artery: Secondary | ICD-10-CM | POA: Diagnosis present

## 2020-02-08 DIAGNOSIS — Z8673 Personal history of transient ischemic attack (TIA), and cerebral infarction without residual deficits: Secondary | ICD-10-CM | POA: Diagnosis not present

## 2020-02-08 DIAGNOSIS — Z7982 Long term (current) use of aspirin: Secondary | ICD-10-CM | POA: Diagnosis not present

## 2020-02-08 DIAGNOSIS — Z79899 Other long term (current) drug therapy: Secondary | ICD-10-CM | POA: Insufficient documentation

## 2020-02-08 DIAGNOSIS — Z794 Long term (current) use of insulin: Secondary | ICD-10-CM | POA: Diagnosis not present

## 2020-02-08 DIAGNOSIS — N189 Chronic kidney disease, unspecified: Secondary | ICD-10-CM | POA: Insufficient documentation

## 2020-02-08 DIAGNOSIS — E1122 Type 2 diabetes mellitus with diabetic chronic kidney disease: Secondary | ICD-10-CM | POA: Diagnosis not present

## 2020-02-08 DIAGNOSIS — Z888 Allergy status to other drugs, medicaments and biological substances status: Secondary | ICD-10-CM | POA: Diagnosis not present

## 2020-02-08 DIAGNOSIS — I129 Hypertensive chronic kidney disease with stage 1 through stage 4 chronic kidney disease, or unspecified chronic kidney disease: Secondary | ICD-10-CM | POA: Insufficient documentation

## 2020-02-08 LAB — BASIC METABOLIC PANEL
Anion gap: 14 (ref 5–15)
BUN: 24 mg/dL — ABNORMAL HIGH (ref 6–20)
CO2: 26 mmol/L (ref 22–32)
Calcium: 9.8 mg/dL (ref 8.9–10.3)
Chloride: 98 mmol/L (ref 98–111)
Creatinine, Ser: 1.53 mg/dL — ABNORMAL HIGH (ref 0.44–1.00)
GFR calc Af Amer: 45 mL/min — ABNORMAL LOW (ref >60)
GFR calc non Af Amer: 39 mL/min — ABNORMAL LOW (ref >60)
Glucose, Bld: 200 mg/dL — ABNORMAL HIGH (ref 70–99)
Potassium: 4.4 mmol/L (ref 3.5–5.1)
Sodium: 138 mmol/L (ref 135–145)

## 2020-02-08 LAB — CBC
HCT: 38.6 % (ref 36.0–46.0)
Hemoglobin: 12.3 g/dL (ref 12.0–15.0)
MCH: 27.9 pg (ref 26.0–34.0)
MCHC: 31.9 g/dL (ref 30.0–36.0)
MCV: 87.5 fL (ref 80.0–100.0)
Platelets: 324 10*3/uL (ref 150–400)
RBC: 4.41 MIL/uL (ref 3.87–5.11)
RDW: 13.7 % (ref 11.5–15.5)
WBC: 8.5 10*3/uL (ref 4.0–10.5)
nRBC: 0 % (ref 0.0–0.2)

## 2020-02-08 LAB — GLUCOSE, CAPILLARY: Glucose-Capillary: 178 mg/dL — ABNORMAL HIGH (ref 70–99)

## 2020-02-08 LAB — PROTIME-INR
INR: 0.9 (ref 0.8–1.2)
Prothrombin Time: 12.2 seconds (ref 11.4–15.2)

## 2020-02-08 MED ORDER — SODIUM CHLORIDE 0.9 % IV BOLUS: 250.0000 mL | Freq: Once | INTRAVENOUS | Status: DC

## 2020-02-08 MED ORDER — SODIUM CHLORIDE 0.9 % IV SOLN: Freq: Once | INTRAVENOUS | Status: DC

## 2020-02-08 NOTE — Progress Notes (Signed)
Patient ID: Julia Ferguson, female   DOB: 09-22-1971, 50 y.o.   MRN: [id number] INR. Patient here for a diagnostic arteriogram to follow up a previous balloon angioplasty performed on Dev 4 2019. Patient accompanied by mother with whon she lives.. Clinically patient remains weak in her Rt arm> Rt leg with moderate expressive aphasia. MRSS of 2 to 3 per mother. O/E awake .Oriented to the place  Able to name and identify simple objects with hesitancy.  Motor. RT UE 0-1/5 prox and distally. Able to lift RT leg just off the bed 3/5. Plan . D/W patient and mother the catheter arteriogram. Given the patients clinical condition it was decided to perform  A CT angiogram of the head and neck instead of a catheter arteriogram, the former being less invasive. CTA of the head and neck will therefore be obtained hopefully this am. S.Veatrice Eckstein MD .

## 2020-02-08 NOTE — H&P (Signed)
Chief Complaint: Patient was seen in consultation today for Cerebral arteriogram at request of Dr Jerel Shepherd   Supervising Physician: Julieanne Cotton  Patient Status: [facility] - Out-pt  History of Present Illness: Julia Ferguson is a 49 y.o. female   CVA---left middle cerebral artery stenosis 08/27/18: IMPRESSION: Status post percutaneous transluminal angioplasty of the left middle cerebral artery M1 segment with post angioplasty patency being 80-85%. 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 The cervical left ICA is asymmetrically diminutive, but patent without focal stenosis. These findings are unchanged. 2. 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 Bilateral vertebral arteries otherwise patent within the neck without stenosis. 3. 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 Unchanged mild/moderate C2-C3 spinal canal stenosis.  CTA head: 1. Mild/moderate stenoses of the cavernous left ICA appear somewhat improved. 2. Mild/moderate stenosis within the cavernous/paraclinoid right ICA, unchanged. 3. Redemonstrated A2 left ACA occlusion. 4. A previously demonstrated stenosis within the proximal M1 left MCA has improved, now mild. Persistent moderate stenosis more distally within the left M1 segment. 5. Unchanged severe irregularity and stenosis of the left PCA with attenuation of left PCA branches. 6. Unchanged moderate irregularity and stenosis of the distal P2 right PCA.  Scheduled today for follow up with Dr Corliss Skains Mother says pt is doing well-- no new symptoms  Past Medical History:  Diagnosis Date  . Chronic kidney disease   . Diabetes mellitus without complication (HCC)   .  Hypertension   . Right sided weakness    post - stroke  . Stroke [facility]) 2017, 2018, 2019    Past Surgical History:  Procedure Laterality Date  . ABDOMINAL HYSTERECTOMY    . CESAREAN SECTION    . EYE SURGERY     laser   . IR ANGIO INTRA EXTRACRAN SEL COM CAROTID INNOMINATE BILAT MOD SED  08/15/2018  . IR ANGIO VERTEBRAL SEL VERTEBRAL BILAT MOD SED  08/15/2018  . IR PTA INTRACRANIAL  08/26/2018  . RADIOLOGY WITH ANESTHESIA N/A 08/26/2018   Procedure: STENT PLACEMENT RADIOLOGY WITH ANESTHESIA;  Surgeon: Radiologist, Medication, MD;  Location: [facility] OR;  Service: Radiology;  Laterality: N/A;    Allergies: Lisinopril  Medications: Prior to Admission medications   Medication Sig Start Date End Date Taking? Authorizing Provider  amLODipine (NORVASC) 10 MG tablet Take 10 mg by mouth daily.   Yes [provider]  aspirin EC 81 MG tablet Take 81 mg by mouth daily.   Yes [provider]  atorvastatin (LIPITOR) 80 MG tablet Take 80 mg by mouth every evening.    Yes [provider]  baclofen (LIORESAL) 10 MG tablet Take 5 mg by mouth 3 (three) times daily.   Yes [provider]  carvedilol (COREG) 25 MG tablet Take 25 mg by mouth 2 (two) times daily with a meal.   Yes [provider]  cholecalciferol (VITAMIN D3) 25 MCG (1000 UNIT) tablet Take 1,000 Units by mouth daily.   Yes [provider]  docusate sodium (COLACE) 100 MG capsule Take 100 mg by mouth every evening.   Yes [provider]  ferrous sulfate 325 (65 FE) MG tablet Take 325 mg by mouth See admin instructions. Mon Wed and Fri   Yes [provider]  gabapentin (  NEURONTIN) 600 MG tablet Take 600 mg by mouth 2 (two) times daily.    Yes [provider]  Insulin Glargine (BASAGLAR KWIKPEN) 100 UNIT/ML SOPN Inject 24 Units into the skin 2 (two) times daily.    Yes [provider]  Insulin Syringe-Needle U-100 25G X 1" 1 ML MISC For 4 times a day insulin  SQ, 1 month supply. Diagnosis E11.65 08/17/18  Yes Thurnell Lose, MD  minoxidil (LONITEN) 2.5 MG tablet Take 2.5 mg by mouth 2 (two) times daily. 09/24/19  Yes [provider]  pantoprazole (PROTONIX) 40 MG tablet Take 1 tablet (40 mg total) by mouth daily. Patient taking differently: Take 40 mg by mouth 2 (two) times daily.  08/17/18  Yes Thurnell Lose, MD  telmisartan (MICARDIS) 80 MG tablet Take 80 mg by mouth daily.   Yes [provider]  traMADol (ULTRAM) 50 MG tablet Take 50 mg by mouth daily as needed for moderate pain.    Yes [provider]  insulin aspart (NOVOLOG) 100 UNIT/ML injection Substitute to any brand approved. . Before each meal 3 times a day, 140-199 - 2 units, 200-250 - 4 units, 251-299 - 6 units,  300-349 - 8 units,  350 or above 10 units. Dispense syringes and needles as needed, Ok to switch to PEN if approved. DX DM2, Code E11.65 Patient not taking: Reported on 02/01/2020 08/17/18   Thurnell Lose, MD     Family History  Problem Relation Age of Onset  . Hypertension Other   . Kidney failure Other   . Hypertension Mother   . Diabetes Mother   . Colon cancer Father     Social History   Socioeconomic History  . Marital status: Divorced    Spouse name: Not on file  . Number of children: Not on file  . Years of education: Not on file  . Highest education level: Not on file  Occupational History  . Not on file  Tobacco Use  . Smoking status: Never Smoker  . Smokeless tobacco: Never Used  Substance and Sexual Activity  . Alcohol use: Never  . Drug use: Never  . Sexual activity: Not on file  Other Topics Concern  . Not on file  Social History Narrative  . Not on file   Social Determinants of Health   Financial Resource Strain:   . Difficulty of Paying Living Expenses:   Food Insecurity:   . Worried About Charity fundraiser in the Last Year:   . Arboriculturist in the Last Year:   Transportation Needs:   . Lexicographer (Medical):   Marland Kitchen Lack of Transportation (Non-Medical):   Physical Activity:   . Days of Exercise per Week:   . Minutes of Exercise per Session:   Stress:   . Feeling of Stress :   Social Connections:   . Frequency of Communication with Friends and Family:   . Frequency of Social Gatherings with Friends and Family:   . Attends Religious Services:   . Active Member of Clubs or Organizations:   . Attends Archivist Meetings:   Marland Kitchen Marital Status:      Review of Systems: A 12 point ROS discussed and pertinent positives are indicated in the HPI above.  All other systems are negative.  Review of Systems  Constitutional: Negative for activity change, appetite change, fatigue, fever and unexpected weight change.  HENT: Negative for tinnitus and trouble swallowing.   Eyes:  Negative for visual disturbance.  Respiratory: Negative for cough and shortness of breath.   Cardiovascular: Negative for chest pain.  Gastrointestinal: Negative for abdominal pain, nausea and vomiting.  Musculoskeletal: Negative for back pain and gait problem.  Neurological: Positive for speech difficulty and weakness. Negative for dizziness, tremors, seizures, syncope, facial asymmetry, light-headedness, numbness and headaches.  Psychiatric/Behavioral: Positive for decreased concentration. Negative for behavioral problems.    Vital Signs: BP (!) 148/86   Pulse 90   Temp 98.1 F (36.7 C)   Resp 14   Ht 5\' 4"  (1.626 m)   Wt 190 lb (86.2 kg)   SpO2 100%   BMI 32.61 kg/m   Physical Exam Vitals reviewed.  HENT:     Head: Atraumatic.     Mouth/Throat:     Mouth: Mucous membranes are moist.  Eyes:     Extraocular Movements: Extraocular movements intact.  Cardiovascular:     Rate and Rhythm: Normal rate and regular rhythm.     Heart sounds: Normal heart sounds.  Pulmonary:     Effort: Pulmonary effort is normal.     Breath sounds: Normal breath sounds.  Abdominal:     Palpations:  Abdomen is soft.  Musculoskeletal:     Comments: Moving Left arm well and to command Moving Rt arm very little Moving both legs with right slower than left Follows all commands  Skin:    General: Skin is warm and dry.  Neurological:     Mental Status: Mental status is at baseline.  Psychiatric:     Comments: Mother at bedside Consented for procedure     Imaging: No results found.  Labs:  CBC: No results for input(s): WBC, HGB, HCT, PLT in the last 8760 hours.  COAGS: No results for input(s): INR, APTT in the last 8760 hours.  BMP: Recent Labs    03/03/19 1546 09/30/19 1700  CREATININE 1.30* 1.40*    LIVER FUNCTION TESTS: No results for input(s): BILITOT, AST, ALT, ALKPHOS, PROT, ALBUMIN in the last 8760 hours.  TUMOR MARKERS: No results for input(s): AFPTM, CEA, CA199, CHROMGRNA in the last 8760 hours.  Assessment and Plan:  CVA L MCA revascularization Dec 2019 Follows with Dr Jan 2020 Scheduled today for re check cerebral arteriogram Risks and benefits of cerebral angiogram with intervention were discussed with the patient including, but not limited to bleeding, infection, vascular injury, contrast induced renal failure, stroke or even death.  This interventional procedure involves the use of X-rays and because of the nature of the planned procedure, it is possible that we will have prolonged use of X-ray fluoroscopy.  Potential radiation risks to you include (but are not limited to) the following: - A slightly elevated risk for cancer  several years later in life. This risk is typically less than 0.5% percent. This risk is low in comparison to the normal incidence of human cancer, which is 33% for women and 50% for men according to the American Cancer Society. - Radiation induced injury can include skin redness, resembling a rash, tissue breakdown / ulcers and hair loss (which can be temporary or permanent).   The likelihood of either of these occurring  depends on the difficulty of the procedure and whether you are sensitive to radiation due to previous procedures, disease, or genetic conditions.   IF your procedure requires a prolonged use of radiation, you will be notified and given written instructions for further action.  It is your responsibility to monitor the irradiated area for the 2  weeks following the procedure and to notify your physician if you are concerned that you have suffered a radiation induced injury.    All of the patient's questions were answered, patient is agreeable to proceed.  Consent signed and in chart.   Thank you for this interesting consult.  I greatly enjoyed meeting Julia Ferguson and look forward to participating in their care.  A copy of this report was sent to the requesting provider on this date.  Electronically Signed: Robet Leu, PA-C 02/08/2020, 8:54 AM   I spent a total of    25 Minutes in face to face in clinical consultation, greater than 50% of which was counseling/coordinating care for cerebral arteriogram

## 2020-03-13 ENCOUNTER — Ambulatory Visit (HOSPITAL_COMMUNITY): Payer: Medicare (Managed Care)

## 2020-03-13 ENCOUNTER — Encounter (HOSPITAL_COMMUNITY): Payer: Self-pay

## 2020-05-16 IMAGING — CT CT ANGIO HEAD
1 of 11 series · 5 of 33 positions shown · IV contrast (APPLIED)
Comparison: CT angiogram head/neck 03/03/2019

CLINICAL DATA: Stenosis of artery.

EXAM:
CT ANGIOGRAPHY HEAD AND NECK
TECHNIQUE: Multidetector CT imaging of the head and neck was performed using
the standard protocol during bolus administration of intravenous
contrast. Multiplanar CT image reconstructions and MIPs were
obtained to evaluate the vascular anatomy. Carotid stenosis
measurements (when applicable) are obtained utilizing NASCET
criteria, using the distal internal carotid diameter as the
denominator.
CONTRAST:  75mL OMNIPAQUE IOHEXOL 350 MG/ML SOLN

[Series 9: ax thins · axial · 0.39mm/px · z∈[+1110,+1346]mm · 5 of 355 slices shown]
[im 60/355  soft-tissue]
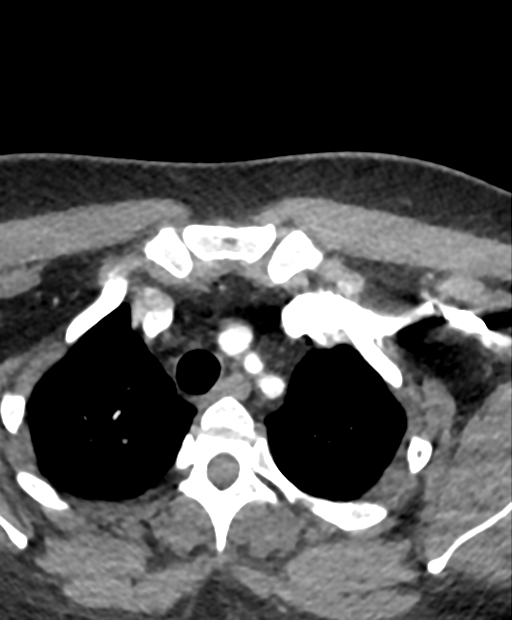
[im 119/355  bone]
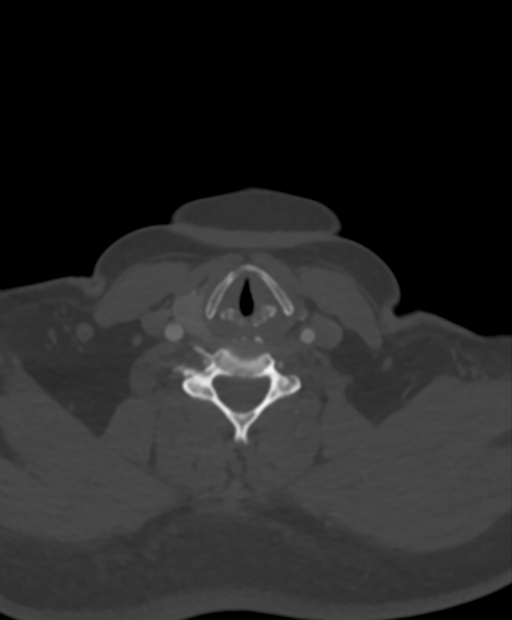
[im 178/355  soft-tissue]
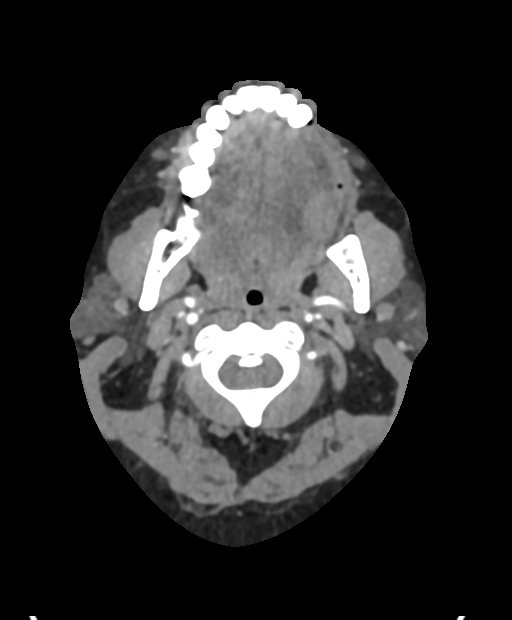
[im 237/355  bone]
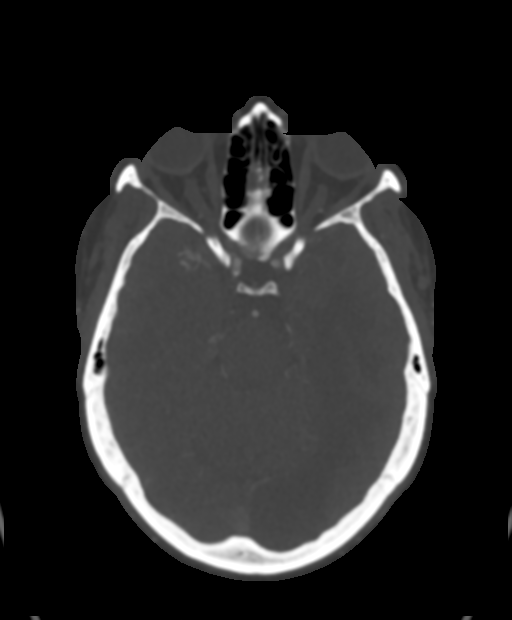
[im 296/355  soft-tissue]
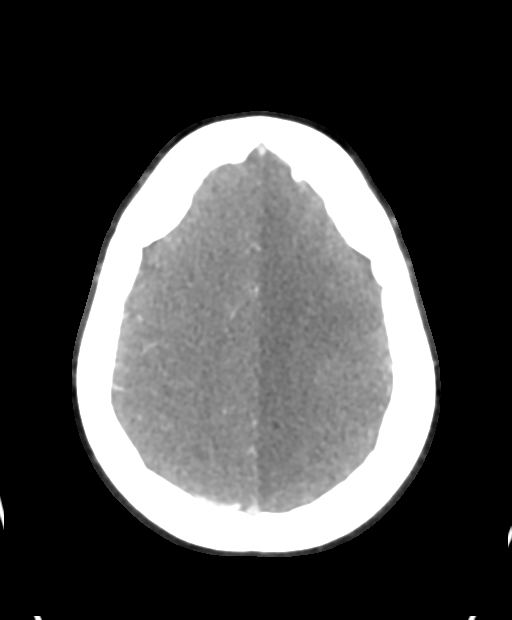

[5 of 33 positions shown; findings below may reference images not displayed]

FINDINGS: CT HEAD FINDINGS

Brain:

Extensive chronic encephalomalacia again demonstrated within the
left cerebral hemisphere, involving the left ACA, MCA and PCA
territories. Findings have not significantly changed since exam
03/03/2019. Associated ex vacuo dilatation of the left lateral
ventricle. No evidence of acute intracranial hemorrhage. No
demarcated infarct within the right cerebral hemisphere. No evidence
of intracranial mass. No midline shift or extra-axial fluid
collection.

Vascular: Reported separately

Skull: Normal. Negative for fracture or focal lesion.

Sinuses: Trace right maxillary sinus mucosal thickening. No
significant mastoid effusion.

Orbits: Visualized orbits demonstrate no acute abnormality.

Review of the MIP images confirms the above findings

CTA NECK FINDINGS

Aortic arch: Standard aortic branching. No significant
atherosclerotic disease within the visualized aortic arch.

Right carotid system: CCA and ICA patent within the neck without
stenosis. Redemonstrated tortuosity of the distal cervical ICA.

Left carotid system: CCA patent without stenosis. As before, the
cervical left ICA is asymmetrically diminutive and tortuous but
patent without focal stenosis to the skull base.

Vertebral arteries: Codominant. The right vertebral artery is patent
throughout the neck without significant stenosis. Redemonstrated
moderate/severe atherosclerotic narrowing at the origin of the left
vertebral artery. Distal to this, the left vertebral artery is
patent within the neck without stenosis.

Skeleton: No acute bony abnormality. Stable appearance of
ossification of the posterior longitudinal ligament within the upper
cervical spine with resultant mild-to-moderate C2-C3 spinal
stenosis.

Other neck: No neck mass or cervical adenopathy. Subcentimeter
nodules within the right thyroid lobe, not meeting consensus
criteria for ultrasound follow-up.

Upper chest: No consolidation within the imaged lung apices.

Review of the MIP images confirms the above findings

CTA HEAD

Anterior circulation:

The right intracranial internal carotid artery is patent. Mixed
plaque results in unchanged sites of mild stenosis within the
cavernous and paraclinoid segments.

The intracranial left internal carotid artery is irregular but
patent. Sites of mild-to-moderate stenosis within the cavernous
segment appear improved from prior examination. Redemonstrated
irregularity of the supraclinoid left ICA with mild stenosis.

The right middle and anterior cerebral arteries are patent without
significant proximal stenosis.

Redemonstrated stenoses within the proximal to mid M1 left middle
cerebral artery. The more proximal stenosis appears improved, now
mild. This stenosis more distally within the M1 segment is
unchanged, moderate. The left MCA bifurcation is patent. No M2
proximal branch occlusion is identified. As before, the A1 left
anterior cerebral artery is patent but irregular. Redemonstrated
occlusion of the proximal A2 segment.

Posterior circulation:

The intracranial vertebral arteries are patent without significant
stenosis, as is the basilar artery. Flow is seen within the PICA
vessels bilaterally. Unchanged severe irregularity and stenosis of
the left PCA with attenuation of the left PCA branches. The right
PCA remains patent, although with redemonstrated moderate
irregularity and stenosis of the distal P2 segment. Posterior
communicating arteries are present bilaterally.

Venous sinuses: Within limitations of contrast timing, no convincing
thrombus.

Anatomic variants: As described
IMPRESSION: CT head:

Extensive chronic encephalomalacia within the left hemisphere,
unchanged from prior examination 03/03/2019

CTA neck:

1. The cervical left ICA is asymmetrically diminutive, but patent
without focal stenosis. These findings are unchanged.
2. Stable moderate/severe stenosis at the origin of the left
vertebral artery. Bilateral vertebral arteries otherwise patent
within the neck without stenosis.
3. Redemonstrated ossification of the posterior longitudinal
ligament within the upper cervical spine. Unchanged mild/moderate
C2-C3 spinal canal stenosis.

CTA head:

1. Mild/moderate stenoses of the cavernous left ICA appear somewhat
improved.
2. Mild/moderate stenosis within the cavernous/paraclinoid right
ICA, unchanged.
3. Redemonstrated A2 left ACA occlusion.
4. A previously demonstrated stenosis within the proximal M1 left
MCA has improved, now mild. Persistent moderate stenosis more
distally within the left M1 segment.
5. Unchanged severe irregularity and stenosis of the left PCA with
attenuation of left PCA branches.
6. Unchanged moderate irregularity and stenosis of the distal P2
right PCA.

## 2024-03-19 ENCOUNTER — Encounter (HOSPITAL_COMMUNITY): Payer: Self-pay | Admitting: Interventional Radiology
